# Patient Record
Sex: Male | Born: 1945 | Race: White | Hispanic: No | Marital: Single | Smoking: Former smoker
Health system: Southern US, Community
[De-identification: ages and names within clinical notes are randomized; demographics above are authoritative.]

## PROBLEM LIST (undated history)

## (undated) DIAGNOSIS — I10 Essential (primary) hypertension: Secondary | ICD-10-CM

## (undated) DIAGNOSIS — H269 Unspecified cataract: Secondary | ICD-10-CM

## (undated) DIAGNOSIS — E785 Hyperlipidemia, unspecified: Secondary | ICD-10-CM

## (undated) DIAGNOSIS — E119 Type 2 diabetes mellitus without complications: Secondary | ICD-10-CM

## (undated) DIAGNOSIS — Z86010 Personal history of colonic polyps: Secondary | ICD-10-CM

## (undated) HISTORY — DX: Essential (primary) hypertension: I10

## (undated) HISTORY — DX: Type 2 diabetes mellitus without complications: E11.9

## (undated) HISTORY — PX: CATARACT EXTRACTION: SUR2

## (undated) HISTORY — DX: Unspecified cataract: H26.9

## (undated) HISTORY — PX: OTHER SURGICAL HISTORY: SHX169

## (undated) HISTORY — DX: Personal history of colonic polyps: Z86.010

## (undated) HISTORY — DX: Hyperlipidemia, unspecified: E78.5

---

## 2008-11-19 ENCOUNTER — Ambulatory Visit: Payer: Self-pay | Admitting: Family Medicine

## 2010-07-29 ENCOUNTER — Ambulatory Visit
Admission: RE | Admit: 2010-07-29 | Discharge: 2010-07-29 | Payer: Self-pay | Source: Home / Self Care | Attending: Family Medicine | Admitting: Family Medicine

## 2010-12-25 ENCOUNTER — Telehealth: Payer: Self-pay | Admitting: Medical

## 2010-12-25 NOTE — Telephone Encounter (Signed)
Pt actually called for someone else

## 2011-06-25 ENCOUNTER — Encounter: Payer: Self-pay | Admitting: Family Medicine

## 2011-06-25 ENCOUNTER — Ambulatory Visit (INDEPENDENT_AMBULATORY_CARE_PROVIDER_SITE_OTHER): Payer: Self-pay | Admitting: Family Medicine

## 2011-06-25 VITALS — BP 142/90 | HR 82 | Wt 241.0 lb

## 2011-06-25 DIAGNOSIS — R040 Epistaxis: Secondary | ICD-10-CM

## 2011-06-25 NOTE — Progress Notes (Signed)
  Subjective:    Patient ID: Peter Daniels, male    DOB: 04/20/1946, 65 y.o.   MRN: [id number]  HPI He is here for evaluation of a history of over the last several weeks having 3 episodes of epistaxis mainly from the left. Last one lasted several hours. He is concerned over the need for cauterization.   Review of Systems     Objective:   Physical Exam Alert and in no distress. Exam of his nares does show redness and irritation to the medial aspect of the nares but no true vessels. Right nares is normal.      Assessment & Plan:  Recent epistaxis. Explained since there was no actual vessels, cauterizing would not be appropriate. Discussed proper care of nosebleeds with compression and the potential use of Afrin to help constrict vessels.

## 2011-06-25 NOTE — Patient Instructions (Signed)
With your next nose bleed when you had slightly forward and compress the soft part of your nose. He may also use Afrin on tissue or cotton swab to help with constriction.

## 2011-07-09 ENCOUNTER — Other Ambulatory Visit (INDEPENDENT_AMBULATORY_CARE_PROVIDER_SITE_OTHER): Payer: Self-pay

## 2011-07-09 DIAGNOSIS — Z23 Encounter for immunization: Secondary | ICD-10-CM

## 2013-03-09 ENCOUNTER — Encounter: Payer: Self-pay | Admitting: Internal Medicine

## 2013-03-10 ENCOUNTER — Ambulatory Visit (INDEPENDENT_AMBULATORY_CARE_PROVIDER_SITE_OTHER): Payer: Medicare Other | Admitting: Family Medicine

## 2013-03-10 ENCOUNTER — Encounter: Payer: Self-pay | Admitting: Family Medicine

## 2013-03-10 VITALS — BP 130/88 | HR 95 | Ht 70.0 in | Wt 236.0 lb

## 2013-03-10 DIAGNOSIS — L719 Rosacea, unspecified: Secondary | ICD-10-CM

## 2013-03-10 DIAGNOSIS — Z0000 Encounter for general adult medical examination without abnormal findings: Secondary | ICD-10-CM | POA: Diagnosis not present

## 2013-03-10 DIAGNOSIS — E669 Obesity, unspecified: Secondary | ICD-10-CM | POA: Diagnosis not present

## 2013-03-10 DIAGNOSIS — Z23 Encounter for immunization: Secondary | ICD-10-CM | POA: Diagnosis not present

## 2013-03-10 LAB — POCT URINALYSIS DIPSTICK
Bilirubin, UA: NEGATIVE
Blood, UA: NEGATIVE
Ketones, UA: NEGATIVE
pH, UA: 7

## 2013-03-10 LAB — CBC WITH DIFFERENTIAL/PLATELET
Basophils Relative: 1 % (ref 0–1)
Eosinophils Absolute: 0.2 10*3/uL (ref 0.0–0.7)
HCT: 46.3 % (ref 39.0–52.0)
Hemoglobin: 16 g/dL (ref 13.0–17.0)
MCH: 30.2 pg (ref 26.0–34.0)
MCHC: 34.6 g/dL (ref 30.0–36.0)
MCV: 87.5 fL (ref 78.0–100.0)
Monocytes Absolute: 0.4 10*3/uL (ref 0.1–1.0)
Monocytes Relative: 5 % (ref 3–12)

## 2013-03-10 NOTE — Progress Notes (Signed)
  Subjective:    Patient ID: Peter Daniels, male    DOB: 01/26/1946, 67 y.o.   MRN: [id number]  HPI He is here for complete examination. He has some difficulty with rosacea and would like to be referred to dermatology. He is taking aspirin. He is on no other medications. His exercise pattern is quite minimal. His immunizations were reviewed. Family and social history were also reviewed. He does admit to drinking 3 beverages per night.   Review of Systems  Constitutional: Negative.   HENT: Negative.   Eyes: Negative.   Respiratory: Negative.   Cardiovascular: Negative.   Gastrointestinal: Negative.   Endocrine: Negative.   Genitourinary: Negative.   Musculoskeletal: Negative.   Allergic/Immunologic: Negative.   Neurological: Negative.   Hematological: Negative.   Psychiatric/Behavioral: Negative.        Objective:   Physical Exam BP 130/88  Pulse 95  Ht 5\' 10"  (1.778 m)  Wt 236 lb (107.049 kg)  BMI 33.86 kg/m2  General Appearance:    Alert, cooperative, no distress, appears stated age  Head:    Normocephalic, without obvious abnormality, atraumatic  Eyes:    PERRL, conjunctiva/corneas clear, EOM's intact, fundi    benign  Ears:    Normal TM's and external ear canals  Nose:   Nares normal, mucosa normal, no drainage or sinus   tenderness  Throat:   Lips, mucosa, and tongue normal; teeth and gums normal  Neck:   Supple, no lymphadenopathy;  thyroid:  no   enlargement/tenderness/nodules; no carotid   bruit or JVD  Back:    Spine nontender, no curvature, ROM normal, no CVA     tenderness  Lungs:     Clear to auscultation bilaterally without wheezes, rales or     ronchi; respirations unlabored  Chest Wall:    No tenderness or deformity   Heart:    Regular rate and rhythm, S1 and S2 normal, no murmur, rub   or gallop  Breast Exam:    No chest wall tenderness, masses or gynecomastia  Abdomen:     Soft, non-tender, nondistended, normoactive bowel sounds,    no masses, no  hepatosplenomegaly  Genitalia:    Normal male external genitalia without lesions.  Testicles without masses.  No inguinal hernias.  Rectal:   deferred   Extremities:   No clubbing, cyanosis or edema  Pulses:   2+ and symmetric all extremities  Skin:   Skin color, texture, turgor normal, slight erythema is noted over the alar area as well as nose.   Lymph nodes:   Cervical, supraclavicular, and axillary nodes normal  Neurologic:   CNII-XII intact, normal strength, sensation and gait; reflexes 2+ and symmetric throughout          Psych:   Normal mood, affect, hygiene and grooming.          Assessment & Plan:  Routine general medical examination at a health care facility - Plan: POCT Urinalysis Dipstick, Pneumococcal polysaccharide vaccine 23-valent greater than or equal to 2yo subcutaneous/IM, HM COLONOSCOPY, CBC with Differential, Comprehensive metabolic panel  Rosacea - Plan: Ambulatory referral to Dermatology  Obesity (BMI 30-39.9) - Plan: CBC with Differential, Comprehensive metabolic panel  pancreas him to increase his physical activity to Lisa 150 minutes per week of anything he would like. Also recommended that he cut back on his alcohol consumption to one or 2 beverages per night.

## 2013-03-11 LAB — COMPREHENSIVE METABOLIC PANEL
Albumin: 4.5 g/dL (ref 3.5–5.2)
Alkaline Phosphatase: 48 U/L (ref 39–117)
BUN: 10 mg/dL (ref 6–23)
Glucose, Bld: 226 mg/dL — ABNORMAL HIGH (ref 70–99)
Total Bilirubin: 2.1 mg/dL — ABNORMAL HIGH (ref 0.3–1.2)

## 2013-03-13 NOTE — Progress Notes (Signed)
Quick Note:  PT INFORMED AND VERBALIZED UNDERSTANDING  ______ 

## 2013-04-04 ENCOUNTER — Telehealth: Payer: Self-pay | Admitting: Internal Medicine

## 2013-04-04 NOTE — Telephone Encounter (Signed)
Pt states that he was suppose to have a GI referral for colonoscopy and a referral to the dermatology. Pt wanted to know if this has been done yet

## 2013-04-06 ENCOUNTER — Telehealth: Payer: Self-pay | Admitting: Family Medicine

## 2013-04-06 ENCOUNTER — Other Ambulatory Visit: Payer: Self-pay

## 2013-04-06 DIAGNOSIS — Z1389 Encounter for screening for other disorder: Secondary | ICD-10-CM

## 2013-04-06 DIAGNOSIS — Z1211 Encounter for screening for malignant neoplasm of colon: Secondary | ICD-10-CM

## 2013-04-06 NOTE — Telephone Encounter (Signed)
DR.LUPTON OCT 6 3:30 LUPTON

## 2013-04-06 NOTE — Telephone Encounter (Signed)
Please call patient concerning dermatology referral

## 2013-04-06 NOTE — Telephone Encounter (Signed)
Colonoscopy sent to [facility] and waiting on der. Dr Terri Piedra to open back up at 2

## 2013-04-11 ENCOUNTER — Encounter: Payer: Self-pay | Admitting: Gastroenterology

## 2013-05-16 DIAGNOSIS — L819 Disorder of pigmentation, unspecified: Secondary | ICD-10-CM | POA: Diagnosis not present

## 2013-05-16 DIAGNOSIS — L219 Seborrheic dermatitis, unspecified: Secondary | ICD-10-CM | POA: Diagnosis not present

## 2013-05-16 DIAGNOSIS — B356 Tinea cruris: Secondary | ICD-10-CM | POA: Diagnosis not present

## 2013-05-16 DIAGNOSIS — L570 Actinic keratosis: Secondary | ICD-10-CM | POA: Diagnosis not present

## 2013-05-16 DIAGNOSIS — D235 Other benign neoplasm of skin of trunk: Secondary | ICD-10-CM | POA: Diagnosis not present

## 2013-06-19 ENCOUNTER — Other Ambulatory Visit: Payer: Self-pay | Admitting: Gastroenterology

## 2014-01-02 ENCOUNTER — Ambulatory Visit (INDEPENDENT_AMBULATORY_CARE_PROVIDER_SITE_OTHER): Payer: Medicare Other | Admitting: Family Medicine

## 2014-01-02 ENCOUNTER — Encounter: Payer: Self-pay | Admitting: Family Medicine

## 2014-01-02 DIAGNOSIS — L089 Local infection of the skin and subcutaneous tissue, unspecified: Secondary | ICD-10-CM

## 2014-01-02 DIAGNOSIS — L723 Sebaceous cyst: Secondary | ICD-10-CM

## 2014-01-02 NOTE — Progress Notes (Signed)
   Subjective:    Patient ID: Peter Daniels, male    DOB: [birth year], 68 y.o.   MRN: [id number]  HPI He is here for evaluation and treatment of a painful lesion to the left cheek that has been increasing in size over the last week.   Review of Systems     Objective:   Physical Exam Exam of the left cheek shows a 3 cm red warm and tender lesion with central dimple.       Assessment & Plan:  Infected sebaceous cyst  the lesion was injected with Xylocaine and epinephrine. A 2 cm incision was main in the lines of facial expression. Purulent material with cyst sac was removed. The wound was cleaned thoroughly and packed with iodoform. He will return here in 2 days for recheck.

## 2014-01-04 ENCOUNTER — Ambulatory Visit (INDEPENDENT_AMBULATORY_CARE_PROVIDER_SITE_OTHER): Payer: Medicare Other | Admitting: Medical

## 2014-01-04 ENCOUNTER — Encounter: Payer: Self-pay | Admitting: Medical

## 2014-01-04 VITALS — BP 140/80 | HR 82 | Temp 98.0°F | Resp 17 | Wt 233.0 lb

## 2014-01-04 DIAGNOSIS — L0201 Cutaneous abscess of face: Secondary | ICD-10-CM

## 2014-01-04 DIAGNOSIS — L03211 Cellulitis of face: Secondary | ICD-10-CM

## 2014-01-04 MED ORDER — SULFAMETHOXAZOLE-TMP DS 800-160 MG PO TABS: 1.0000 | ORAL_TABLET | Freq: Two times a day (BID) | ORAL | Status: DC

## 2014-01-04 NOTE — Progress Notes (Signed)
Subjective: Here for followup on abscess of face. Was seen by Dr. Redmond School a few days ago had an incision and drainage of left cheek abscess.  He feels like the area is more painful now more discomfort, there is some drainage but no fever, nausea or vomiting  Objective: Left cheek with at least 3 cm area of induration, there is an incision with packing in place  Assessment: Encounter Diagnosis  Name Primary?  . Facial abscess Yes   Plan: Clean left cheek in normal sterile fashion, removed old packing,irrigated with high pressure saline, used about 3 cm of one quarter inch iodoform packing. Covered with sterile bandage  Begin Bactrim antibiotic, he'll remove the packing on Sunday.  Continue to keep the area clean with soap and water, followup here on Monday

## 2014-01-08 ENCOUNTER — Ambulatory Visit (INDEPENDENT_AMBULATORY_CARE_PROVIDER_SITE_OTHER): Payer: Medicare Other | Admitting: Family Medicine

## 2014-01-08 DIAGNOSIS — L03211 Cellulitis of face: Secondary | ICD-10-CM

## 2014-01-08 DIAGNOSIS — L0201 Cutaneous abscess of face: Secondary | ICD-10-CM

## 2014-01-08 NOTE — Progress Notes (Signed)
   Subjective:    Patient ID: Peter Daniels, male    DOB: 1946-05-21, 68 y.o.   MRN: [id number]  HPI He is here for recheck. He did remove the packing yesterday. The wound is still draining.   Review of Systems     Objective:   Physical Exam He has a 3-4 cm area of induration. Purulent material was expressed from the wound.       Assessment & Plan:  Facial abscess  the wound was probed and repacked with a small amount of iodoform. He is to return here on Thursday for a recheck. Continue the antibiotic.

## 2014-01-11 ENCOUNTER — Ambulatory Visit (INDEPENDENT_AMBULATORY_CARE_PROVIDER_SITE_OTHER): Payer: Medicare Other | Admitting: Family Medicine

## 2014-01-11 ENCOUNTER — Other Ambulatory Visit: Payer: Self-pay

## 2014-01-11 DIAGNOSIS — R7309 Other abnormal glucose: Secondary | ICD-10-CM | POA: Diagnosis not present

## 2014-01-11 DIAGNOSIS — E119 Type 2 diabetes mellitus without complications: Secondary | ICD-10-CM

## 2014-01-11 DIAGNOSIS — L03211 Cellulitis of face: Secondary | ICD-10-CM | POA: Diagnosis not present

## 2014-01-11 DIAGNOSIS — L0201 Cutaneous abscess of face: Secondary | ICD-10-CM

## 2014-01-11 LAB — CBC WITH DIFFERENTIAL/PLATELET
Basophils Absolute: 0.1 10*3/uL (ref 0.0–0.1)
Basophils Relative: 1 % (ref 0–1)
EOS PCT: 3 % (ref 0–5)
Eosinophils Absolute: 0.2 10*3/uL (ref 0.0–0.7)
HEMATOCRIT: 43.1 % (ref 39.0–52.0)
Hemoglobin: 15 g/dL (ref 13.0–17.0)
LYMPHS ABS: 1.6 10*3/uL (ref 0.7–4.0)
LYMPHS PCT: 24 % (ref 12–46)
MCH: 30.4 pg (ref 26.0–34.0)
MCHC: 34.8 g/dL (ref 30.0–36.0)
MCV: 87.2 fL (ref 78.0–100.0)
MONO ABS: 0.5 10*3/uL (ref 0.1–1.0)
MONOS PCT: 7 % (ref 3–12)
Neutro Abs: 4.2 10*3/uL (ref 1.7–7.7)
Neutrophils Relative %: 65 % (ref 43–77)
PLATELETS: 205 10*3/uL (ref 150–400)
RBC: 4.94 MIL/uL (ref 4.22–5.81)
RDW: 13.3 % (ref 11.5–15.5)
WBC: 6.5 10*3/uL (ref 4.0–10.5)

## 2014-01-11 LAB — LIPID PANEL
Cholesterol: 242 mg/dL — ABNORMAL HIGH (ref 0–200)
HDL: 53 mg/dL (ref >39)
LDL Cholesterol: 146 mg/dL — ABNORMAL HIGH (ref 0–99)
TRIGLYCERIDES: 213 mg/dL — AB (ref <150)
Total CHOL/HDL Ratio: 4.6 Ratio
VLDL: 43 mg/dL — ABNORMAL HIGH (ref 0–40)

## 2014-01-11 LAB — COMPREHENSIVE METABOLIC PANEL
ALT: 19 U/L (ref 0–53)
AST: 18 U/L (ref 0–37)
Albumin: 4.3 g/dL (ref 3.5–5.2)
Alkaline Phosphatase: 52 U/L (ref 39–117)
BUN: 12 mg/dL (ref 6–23)
CHLORIDE: 95 meq/L — AB (ref 96–112)
CO2: 23 mEq/L (ref 19–32)
CREATININE: 0.9 mg/dL (ref 0.50–1.35)
Calcium: 9.4 mg/dL (ref 8.4–10.5)
GLUCOSE: 349 mg/dL — AB (ref 70–99)
Potassium: 4.4 mEq/L (ref 3.5–5.3)
Sodium: 128 mEq/L — ABNORMAL LOW (ref 135–145)
Total Bilirubin: 1.3 mg/dL — ABNORMAL HIGH (ref 0.2–1.2)
Total Protein: 7.2 g/dL (ref 6.0–8.3)

## 2014-01-11 LAB — POCT GLYCOSYLATED HEMOGLOBIN (HGB A1C): Hemoglobin A1C: 9.5

## 2014-01-11 MED ORDER — ONETOUCH DELICA LANCETS FINE MISC: 1.0000 | Freq: Two times a day (BID) | Status: DC

## 2014-01-11 MED ORDER — ONETOUCH VERIO IQ SYSTEM W/DEVICE KIT: PACK | Status: DC

## 2014-01-11 MED ORDER — GLUCOSE BLOOD VI STRP: 1.0000 | ORAL_STRIP | Freq: Two times a day (BID) | Status: DC

## 2014-01-11 MED ORDER — SULFAMETHOXAZOLE-TMP DS 800-160 MG PO TABS: 1.0000 | ORAL_TABLET | Freq: Two times a day (BID) | ORAL | Status: DC

## 2014-01-11 MED ORDER — METFORMIN HCL 500 MG PO TABS: 500.0000 mg | ORAL_TABLET | Freq: Two times a day (BID) | ORAL | Status: DC

## 2014-01-11 NOTE — Patient Instructions (Signed)
Check the American diabetes association website or [facility]org

## 2014-01-11 NOTE — Progress Notes (Signed)
   Subjective:    Patient ID: Peter Daniels, male    DOB: 06-17-1946, 68 y.o.   MRN: [id number]  HPI He is here for a recheck on facial abscess. He states it is not hurting as much and there is no drainage. I also reviewed his record and noted a elevated blood sugars that was misread as normal. He has had no difficulty with polyuria, polydipsia, weight loss.   Review of Systems     Objective:   Physical Exam Alert and in no distress. The induration is reduced by at least 1 cm. No drainage is noted. The packing was removed. Hemoglobin A1c is 9.5        Assessment & Plan:  Elevated glucose - Plan: POCT glycosylated hemoglobin (Hb A1C)  Facial abscess - Plan: sulfamethoxazole-trimethoprim (BACTRIM DS) 800-160 MG per tablet  New onset type 2 diabetes mellitus - Plan: Amb Referral to [facility], metFORMIN (GLUCOPHAGE) 500 MG tablet, CBC with Differential, Comprehensive metabolic panel, Lipid panel  he will use warm soaks and irrigate the lesion on his face. I will give him another 10 days worth of Septra. I discussed diabetes with him. Discussed diet, exercise, medications for blood pressure cholesterol and blood sugar. We also discussed risk factors of stroke, blindness, heart disease, kidney failure and are up and neurologic damage as well as other vascular issues. He was given instructions on use of the glucometer and when to test that specifically before meals or 2 hours after that. Return here in 2 weeks for recheck. Also discussed starting metformin and possible side effects. He will call if he has any problems.

## 2014-01-11 NOTE — Telephone Encounter (Signed)
Sent in all diabetes supply

## 2014-01-15 ENCOUNTER — Encounter: Payer: Self-pay | Admitting: Family Medicine

## 2014-01-15 ENCOUNTER — Other Ambulatory Visit: Payer: Self-pay

## 2014-01-15 ENCOUNTER — Ambulatory Visit (INDEPENDENT_AMBULATORY_CARE_PROVIDER_SITE_OTHER): Payer: Medicare Other | Admitting: Family Medicine

## 2014-01-15 ENCOUNTER — Telehealth: Payer: Self-pay

## 2014-01-15 VITALS — BP 140/80 | HR 96 | Wt 226.0 lb

## 2014-01-15 DIAGNOSIS — E119 Type 2 diabetes mellitus without complications: Secondary | ICD-10-CM | POA: Diagnosis not present

## 2014-01-15 MED ORDER — GLYBURIDE 5 MG PO TABS: 5.0000 mg | ORAL_TABLET | Freq: Every day | ORAL | Status: DC

## 2014-01-15 NOTE — Patient Instructions (Addendum)
Hold taking the metformin. We'll put you on a different medicine short-term for the diabetes. Call me when the diarrhea stops and then we'll talk about switching you back to the metformin

## 2014-01-15 NOTE — Progress Notes (Signed)
Patient states that his blood sugar avg. Since Saturday has been 220 I took his sugar reading in office today it was 233 also he states that the metformin he thinks is making him sick.

## 2014-01-15 NOTE — Telephone Encounter (Signed)
DR.LALONDE Peter Daniels IS TAKING REG METFORMIN THE PHARMACIST SAID THAT GEN. FOR XR IS METFORMIN XR 500 24 HR

## 2014-01-15 NOTE — Progress Notes (Signed)
   Subjective:    Patient ID: Peter Daniels, male    DOB: 07/22/1945, 68 y.o.   MRN: [id number]  HPI He is here for recheck. His blood sugars have been running in the low 200s. He has had some difficulty with diarrhea and has vomited on 2 separate occasions. He does feel somewhat weak.   Review of Systems     Objective:   Physical Exam Alert and in no distress otherwise not examined       Assessment & Plan:  New onset type 2 diabetes mellitus - Plan: glyBURIDE (DIABETA) 5 MG tablet  he is to hold his metformin to minimize its effect on the diarrhea. We will check on the type of metformin he is on and possibly changing to long-acting. He is to call me when his diarrhea stops.

## 2014-01-17 ENCOUNTER — Telehealth: Payer: Self-pay | Admitting: Family Medicine

## 2014-01-17 NOTE — Telephone Encounter (Signed)
Left word for word message on pt cell # 

## 2014-01-17 NOTE — Telephone Encounter (Signed)
Pt called and stated he was to inform you when the diarrhea had stopped. Pt states it stopped yesterday.

## 2014-01-17 NOTE — Telephone Encounter (Signed)
Having start back on the metformin and let us know if the diarrhea recurs. If it does then switch him to the long-acting variety

## 2014-01-30 ENCOUNTER — Ambulatory Visit (INDEPENDENT_AMBULATORY_CARE_PROVIDER_SITE_OTHER): Payer: Medicare Other | Admitting: Family Medicine

## 2014-01-30 VITALS — BP 120/80 | HR 80 | Temp 97.9°F | Wt 221.0 lb

## 2014-01-30 DIAGNOSIS — E119 Type 2 diabetes mellitus without complications: Secondary | ICD-10-CM

## 2014-01-30 NOTE — Progress Notes (Signed)
   Subjective:    Patient ID: Peter Daniels, male    DOB: [birth year], 68 y.o.   MRN: [id number]  HPI He is here for recheck. He still does feel weak from recent bout of viral gastroenteritis but states that he is at least 50% better as compared earlier when he was much worse. His blood sugars now run under 250. He did not start on the sulfonylurea. He does continue on his Glucophage. He has had no more difficulty with diarrhea.   Review of Systems     Objective:   Physical Exam Alert and in no distress otherwise not examined       Assessment & Plan:  New onset type 2 diabetes mellitus  I explained that since his blood sugars are running below 250, we will continue the metformin. He does realize with weight loss and dietary changes as well as exercise he might possibly be a low back off on some of his medications and that is his goal.

## 2014-02-12 ENCOUNTER — Other Ambulatory Visit: Payer: Self-pay | Admitting: Family Medicine

## 2014-02-12 ENCOUNTER — Telehealth: Payer: Self-pay | Admitting: Family Medicine

## 2014-02-12 NOTE — Telephone Encounter (Signed)
Called and left word for word message pt only needs to test 1 to 2 times a day and to stager it each day before a meal or 2 hrs after a meal

## 2014-02-12 NOTE — Telephone Encounter (Signed)
IS THIS OKAY 

## 2014-02-12 NOTE — Telephone Encounter (Signed)
Let him know that he does not need to check it 4 times per day. Since he is new once or twice a day and still good explain the before and 2 hours after her regimen.

## 2014-02-12 NOTE — Telephone Encounter (Signed)
Pt called and wants to start testing his blood sugars 4 times a day and request refill on his strips and lancets to [facility] on [address] in [place].  Pt ph [phone]

## 2014-02-15 NOTE — Telephone Encounter (Signed)
PT INFORMED WE DO NOT HAVE ANY SAMPLES SO I ADVISED HIM TO TEST ONE TIME A DAY TILL HE CAN GET A REFILL PT VERBALIZED UNDERSTANDING

## 2014-02-15 NOTE — Telephone Encounter (Signed)
Peter Daniels called back and stated he hadnt heard anything concerning his message. I told him a message was left for him but he stated he didn't get it. Message was relay per [employer] instructions. Pt verbalized understanding but states because he has been testing so much he is out of test strips and his insurance wont pay for it. Do we have any samples of these in the back to get him thru until he can get a refill? If not what should pt do? He only has 20 strips left. Please advise pt.

## 2014-02-27 ENCOUNTER — Encounter: Payer: Medicare Other | Attending: Family Medicine

## 2014-02-27 VITALS — Ht 71.0 in | Wt 215.5 lb

## 2014-02-27 DIAGNOSIS — E119 Type 2 diabetes mellitus without complications: Secondary | ICD-10-CM

## 2014-02-27 DIAGNOSIS — Z713 Dietary counseling and surveillance: Secondary | ICD-10-CM | POA: Insufficient documentation

## 2014-02-27 NOTE — Progress Notes (Signed)
Patient was seen on 02/27/14 for the first of a series of three diabetes self-management courses at [facility].  Patient Education Plan per assessed needs and concerns is to attend four course education program for Diabetes Self Management Education.  Current HbA1c: 9.5%  The following learning objectives were met by the patient during this class:  Describe diabetes  State some common risk factors for diabetes  Defines the role of glucose and insulin  Identifies type of diabetes and pathophysiology  Describe the relationship between diabetes and cardiovascular risk  State the members of the Healthcare Team  States the rationale for glucose monitoring  State when to test glucose  State their individual Target Range  State the importance of logging glucose readings  Describe how to interpret glucose readings  Identifies A1C target  Explain the correlation between A1c and eAG values  State symptoms and treatment of high blood glucose  State symptoms and treatment of low blood glucose  Explain proper technique for glucose testing  Identifies proper sharps disposal  Handouts given during class include:  Living Well with Diabetes book  Carb Counting and Meal Planning book  Meal Plan Card  Carbohydrate guide  Meal planning worksheet  Low Sodium Flavoring Tips  The diabetes portion plate  M0L to eAG Conversion Chart  Diabetes Medications  Diabetes Recommended Care Schedule  Support Group  Diabetes Success Plan  Core Class Satisfaction Survey  Follow-Up Plan:  Attend core 2

## 2014-03-01 ENCOUNTER — Ambulatory Visit (INDEPENDENT_AMBULATORY_CARE_PROVIDER_SITE_OTHER): Payer: Medicare Other | Admitting: Family Medicine

## 2014-03-01 ENCOUNTER — Encounter: Payer: Self-pay | Admitting: Family Medicine

## 2014-03-01 VITALS — BP 126/80 | HR 70 | Wt 215.0 lb

## 2014-03-01 DIAGNOSIS — E785 Hyperlipidemia, unspecified: Secondary | ICD-10-CM | POA: Diagnosis not present

## 2014-03-01 DIAGNOSIS — E119 Type 2 diabetes mellitus without complications: Secondary | ICD-10-CM | POA: Diagnosis not present

## 2014-03-01 DIAGNOSIS — E1169 Type 2 diabetes mellitus with other specified complication: Secondary | ICD-10-CM

## 2014-03-01 DIAGNOSIS — E118 Type 2 diabetes mellitus with unspecified complications: Secondary | ICD-10-CM | POA: Insufficient documentation

## 2014-03-01 MED ORDER — ATORVASTATIN CALCIUM 20 MG PO TABS: 20.0000 mg | ORAL_TABLET | Freq: Every day | ORAL | Status: DC

## 2014-03-01 NOTE — Patient Instructions (Signed)
Your blood sugars before you eat should be in the low 100s and 2 hours after you he should be below 180

## 2014-03-01 NOTE — Progress Notes (Signed)
   Subjective:    Patient ID: Peter Daniels, male    DOB: 1946/06/10, 68 y.o.   MRN: [id number]  HPI He is here for recheck on his diabetes. Since his last visit he has been to the nutritionist and has learned a fair amount. He is now exercising more and checking his blood sugars twice per day. He has noted his blood sugar readings coming down.   Review of Systems     Objective:   Physical Exam Alert and in no distress. His medications were reviewed. Blood studies were reviewed. He does have elevated lipids.       Assessment & Plan:  New onset type 2 diabetes mellitus  Hyperlipidemia associated with type 2 diabetes mellitus - Plan: atorvastatin (LIPITOR) 20 MG tablet  I will start him on Lipitor. Discussed possible side effects of this medication and if he has difficulty he will let me know. I again discussed diet and exercise. I explained the mechanism of diabetes in regard to insulin production. Discussed the fact that with time he will more likely than not require more more medication which is a natural history of this disease. Did recommend he continue to check his blood sugars before and 2 hours after meals. He seems to be making good progress. Recheck here in 2 months.

## 2014-03-02 ENCOUNTER — Encounter: Payer: Self-pay | Admitting: Internal Medicine

## 2014-03-02 DIAGNOSIS — H259 Unspecified age-related cataract: Secondary | ICD-10-CM | POA: Diagnosis not present

## 2014-03-02 DIAGNOSIS — H43819 Vitreous degeneration, unspecified eye: Secondary | ICD-10-CM | POA: Diagnosis not present

## 2014-03-02 DIAGNOSIS — E119 Type 2 diabetes mellitus without complications: Secondary | ICD-10-CM | POA: Diagnosis not present

## 2014-03-02 DIAGNOSIS — H5210 Myopia, unspecified eye: Secondary | ICD-10-CM | POA: Diagnosis not present

## 2014-03-02 LAB — HM DIABETES EYE EXAM

## 2014-03-06 ENCOUNTER — Encounter: Payer: Medicare Other | Attending: Family Medicine

## 2014-03-06 DIAGNOSIS — E119 Type 2 diabetes mellitus without complications: Secondary | ICD-10-CM | POA: Diagnosis not present

## 2014-03-06 DIAGNOSIS — Z713 Dietary counseling and surveillance: Secondary | ICD-10-CM | POA: Diagnosis not present

## 2014-03-06 NOTE — Progress Notes (Signed)

## 2014-03-13 DIAGNOSIS — E1059 Type 1 diabetes mellitus with other circulatory complications: Secondary | ICD-10-CM

## 2014-03-13 DIAGNOSIS — E119 Type 2 diabetes mellitus without complications: Secondary | ICD-10-CM | POA: Diagnosis not present

## 2014-03-13 NOTE — Progress Notes (Signed)
Patient was seen on 03/13/14 for the third of a series of three diabetes self-management courses at the [facility]. The following learning objectives were met by the patient during this class:    State the amount of activity recommended for healthy living   Describe activities suitable for individual needs   Identify ways to regularly incorporate activity into daily life   Identify barriers to activity and ways to over come these barriers  Identify diabetes medications being personally used and their primary action for lowering glucose and possible side effects   Describe role of stress on blood glucose and develop strategies to address psychosocial issues   Identify diabetes complications and ways to prevent them  Explain how to manage diabetes during illness   Evaluate success in meeting personal goal   Establish 2-3 goals that they will plan to diligently work on until they return for the  21-monthfollow-up visit  Goals:  Follow Diabetes Meal Plan as instructed  Aim for 15-30 mins of physical activity daily as tolerated  Bring food record and glucose log to your follow up visit  Your patient has established the following 4 month goals in their individualized success plan: I will count my carb choices at most meals and snacks and monitor portion control I will be active 30 minutes or more 5 times a week I will take my diabetes medications as scheduled I will eat less unhealthy fats  I will test my glucose at least 2 times a day, 7 days a week I will look at patterns in my record book at least 3 days a month To help manage stress I will  Do fun things at least 4 times a week  Your patient has identified these potential barriers to change:  motivation  Your patient has identified their diabetes self-care support plan as  [facility]  Books  Magazine subscriptions  On-Line resources  Plan:  Attend Core 4 in 4 months

## 2014-05-01 ENCOUNTER — Encounter: Payer: Self-pay | Admitting: Family Medicine

## 2014-05-01 ENCOUNTER — Ambulatory Visit (INDEPENDENT_AMBULATORY_CARE_PROVIDER_SITE_OTHER): Payer: Medicare Other | Admitting: Family Medicine

## 2014-05-01 VITALS — BP 120/82 | HR 81 | Wt 198.0 lb

## 2014-05-01 DIAGNOSIS — E119 Type 2 diabetes mellitus without complications: Secondary | ICD-10-CM

## 2014-05-01 DIAGNOSIS — Z23 Encounter for immunization: Secondary | ICD-10-CM | POA: Diagnosis not present

## 2014-05-01 LAB — POCT GLYCOSYLATED HEMOGLOBIN (HGB A1C): HEMOGLOBIN A1C: 6.1

## 2014-05-01 NOTE — Progress Notes (Signed)
  Subjective:    Peter Daniels is a 68 y.o. male who presents for follow-up of Type 2 diabetes mellitus.    Home blood sugar records: Patient test B/S bid some times more they have been running around 130  Current symptoms/problems none Daily foot checks: Any foot concerns: none Last eye exam: 03/02/14    Medication compliance: Excellent Current diet: eating less avoiding sugar  Current exercise: walking more Known diabetic complications: none Cardiovascular risk factors: advanced age (older than 55 for men, 38 for women), diabetes mellitus, dyslipidemia, male gender, obesity (BMI >= 30 kg/m2) and sedentary lifestyle   The following portions of the patient's history were reviewed and updated as appropriate: allergies, current medications, past family history, past medical history and problem list.  ROS as in subjective above    Objective:   General appearence: alert, no distress, WD/WN  Lab Review Lab Results  Component Value Date   HGBA1C 9.5 01/11/2014   Lab Results  Component Value Date   CHOL 242* 01/11/2014   HDL 53 01/11/2014   LDLCALC 146* 01/11/2014   TRIG 213* 01/11/2014   CHOLHDL 4.6 01/11/2014   No results found for this basenameDerl Barrow     Chemistry      Component Value Date/Time   NA 128* 01/11/2014 1115   K 4.4 01/11/2014 1115   CL 95* 01/11/2014 1115   CO2 23 01/11/2014 1115   BUN 12 01/11/2014 1115   CREATININE 0.90 01/11/2014 1115      Component Value Date/Time   CALCIUM 9.4 01/11/2014 1115   ALKPHOS 52 01/11/2014 1115   AST 18 01/11/2014 1115   ALT 19 01/11/2014 1115   BILITOT 1.3* 01/11/2014 1115        Chemistry      Component Value Date/Time   NA 128* 01/11/2014 1115   K 4.4 01/11/2014 1115   CL 95* 01/11/2014 1115   CO2 23 01/11/2014 1115   BUN 12 01/11/2014 1115   CREATININE 0.90 01/11/2014 1115      Component Value Date/Time   CALCIUM 9.4 01/11/2014 1115   ALKPHOS 52 01/11/2014 1115   AST 18 01/11/2014 1115   ALT 19 01/11/2014 1115   BILITOT 1.3*  01/11/2014 1115      Hemoglobin A1c is 6.1    Assessment:  Need for prophylactic vaccination against Streptococcus pneumoniae (pneumococcus) - Plan: Pneumococcal conjugate vaccine 13-valent  Need for prophylactic vaccination and inoculation against influenza - Plan: Flu vaccine HIGH DOSE PF (Fluzone Tri High dose)  Type 2 diabetes mellitus without complication - Plan: POCT glycosylated hemoglobin (Hb A1C)        Plan:    1.  Rx changes: none 2.  Education: Reviewed 'ABCs' of diabetes management (respective goals in parentheses):  A1C (<7), blood pressure (<130/80), and cholesterol (LDL <100). 3.  Compliance at present is estimated to be excellent. Efforts to improve compliance (if necessary) will be directed at increased exercise. 4. Follow up: 4 months  I congratulated him on the good work that he is doing. We will reevaluate this in several months. Discussed possibly even stopping the metformin but did caution that if such is the case, he would still need to be followed closely.

## 2014-06-14 ENCOUNTER — Telehealth: Payer: Self-pay | Admitting: Internal Medicine

## 2014-06-14 NOTE — Telephone Encounter (Signed)
Left a message for pt to call me back. We received a fax from [facility] for diabetic testing supplies. Trying to find out if he is needing this.

## 2014-06-18 NOTE — Telephone Encounter (Signed)
Pt already picked up his supplies

## 2014-07-02 ENCOUNTER — Other Ambulatory Visit: Payer: Self-pay | Admitting: Family Medicine

## 2014-07-09 ENCOUNTER — Encounter: Payer: Medicare Other | Attending: Family Medicine

## 2014-07-09 DIAGNOSIS — Z713 Dietary counseling and surveillance: Secondary | ICD-10-CM | POA: Insufficient documentation

## 2014-07-09 DIAGNOSIS — E119 Type 2 diabetes mellitus without complications: Secondary | ICD-10-CM | POA: Diagnosis not present

## 2014-07-10 NOTE — Progress Notes (Signed)
Appt start time: 0900 end time:  1000.  Patient was seen on 07/09/14 for a review of the series of three diabetes self-management courses at the [facility]. The following learning objectives were met by the patient during this class:  . Reviewed blood glucose monitoring and interpretation including the recommended target ranges and Hgb A1c.  . Reviewed on carb counting, importance of regularly scheduled meals/snacks, and meal planning.  . Reviewed the effects of physical activity on glucose levels and long-term glucose control.  Recommended goal of 150 minutes of physical activity/week. . Reviewed patient medications and discussed role of medication on blood glucose and possible side effects. . Discussed strategies to manage stress, psychosocial issues, and other obstacles to diabetes management. . Encouraged moderate weight reduction to improve glucose levels.   . Reviewed short-term complications: hyper- and hypo-glycemia.  Discussed causes, symptoms, and treatment options. . Reviewed prevention, detection, and treatment of long-term complications.  Discussed the role of prolonged elevated glucose levels on body systems.  Goals:  Follow Diabetes Meal Plan as instructed  Eat 3 meals and 2 snacks, every 3-5 hrs  Limit carbohydrate intake to 45 grams carbohydrate/meal Limit carbohydrate intake to 15 grams carbohydrate/snack Add lean protein foods to meals/snacks  Monitor glucose levels as instructed by your doctor  Aim for goal of 15-30 mins of physical activity daily as tolerated  Bring food record and glucose log to your next nutrition visit

## 2014-09-04 ENCOUNTER — Ambulatory Visit (INDEPENDENT_AMBULATORY_CARE_PROVIDER_SITE_OTHER): Payer: Medicare Other | Admitting: Family Medicine

## 2014-09-04 ENCOUNTER — Encounter: Payer: Self-pay | Admitting: Family Medicine

## 2014-09-04 VITALS — BP 128/88 | HR 70 | Wt 188.0 lb

## 2014-09-04 DIAGNOSIS — E1169 Type 2 diabetes mellitus with other specified complication: Secondary | ICD-10-CM

## 2014-09-04 DIAGNOSIS — E119 Type 2 diabetes mellitus without complications: Secondary | ICD-10-CM | POA: Diagnosis not present

## 2014-09-04 DIAGNOSIS — E785 Hyperlipidemia, unspecified: Secondary | ICD-10-CM | POA: Diagnosis not present

## 2014-09-04 DIAGNOSIS — Z136 Encounter for screening for cardiovascular disorders: Secondary | ICD-10-CM | POA: Diagnosis not present

## 2014-09-04 LAB — POCT GLYCOSYLATED HEMOGLOBIN (HGB A1C): Hemoglobin A1C: 5.6

## 2014-09-04 LAB — POCT UA - MICROALBUMIN
Albumin/Creatinine Ratio, Urine, POC: 7.5
Creatinine, POC: 94.9 mg/dL
Microalbumin Ur, POC: 7.1 mg/L

## 2014-09-04 NOTE — Progress Notes (Signed)
  Subjective:    Patient ID: Peter Daniels, male    DOB: September 28, 1945, 69 y.o.   MRN: [id number]  Peter Daniels is a 69 y.o. male who presents for follow-up of Type 2 diabetes mellitus.  Home blood sugar records: Patient tests regularly and notes in the mid 130s 2 hours postprandial Current symptoms/problems NONE Daily foot checks:   Any foot concerns:none Exercise walks further no real big change EYES: 08/02/13 The following portions of the patient's history were reviewed and updated as appropriate: allergies, current medications, past medical history, past social history and problem list.  ROS as in subjective above.     Objective:    Physical Exam Alert and in no distress otherwise not examined.   Lab Review Diabetic Labs Latest Ref Rng 05/01/2014 01/11/2014 03/10/2013  HbA1c - 6.1 9.5 -  Chol 0 - 200 mg/dL - 242(H) -  HDL >39 mg/dL - 53 -  Calc LDL 0 - 99 mg/dL - 146(H) -  Triglycerides <150 mg/dL - 213(H) -  Creatinine 0.50 - 1.35 mg/dL - 0.90 0.74   BP/Weight 05/01/2014 03/01/2014 02/27/2014 01/30/2014 8/33/8250  Systolic BP 539 767 - 341 937  Diastolic BP 82 80 - 80 80  Wt. (Lbs) 198 215 215.5 221 226  BMI 27.63 30 30.07 31.71 32.43   Foot/eye exam completion dates Latest Ref Rng 03/02/2014  Eye Exam No Retinopathy No Retinopathy  Foot Form Completion - -    [name]  reports that he has quit smoking. He does not have any smokeless tobacco history on file. He reports that he drinks about 12.0 oz of alcohol per week. He reports that he does not use illicit drugs. Hemoglobin A1c is 5.6    Assessment & Plan:      Encounter Diagnoses  Name Primary?  . Type 2 diabetes mellitus without complication Yes  . Hyperlipidemia associated with type 2 diabetes mellitus     changes: stop the metformin 1. Education: Reviewed 'ABCs' of diabetes management (respective goals in parentheses):  A1C (<7), blood pressure (<130/80), and cholesterol (LDL <100). 2. Compliance at  present is estimated to be excellent. Efforts to improve compliance (if necessary) will be directed at continue with diet and exercise. 3. Follow up: 4 months He is doing an excellent job. He is to start the Lipitor again and if he has difficulties with that, call for a different statin. I congratulated him on the good work that he is doing. Screening for AAA will be done. Patient not interested in colonoscopy.

## 2014-09-04 NOTE — Addendum Note (Signed)
Addended by: Minette Headland A on: 09/04/2014 09:43 AM   Modules accepted: Medications

## 2014-09-04 NOTE — Patient Instructions (Signed)
Check your blood sugars a couple times per week and if they start to go up and check them more often. Try the Lipitor and if there is any problem call me back

## 2014-09-24 ENCOUNTER — Ambulatory Visit (HOSPITAL_COMMUNITY)
Admission: RE | Admit: 2014-09-24 | Discharge: 2014-09-24 | Disposition: A | Discharge status: Home / Self Care | Payer: Medicare Other | Source: Ambulatory Visit | Attending: Family Medicine | Admitting: Family Medicine

## 2014-09-24 ENCOUNTER — Other Ambulatory Visit: Payer: Self-pay

## 2014-09-24 ENCOUNTER — Telehealth: Payer: Self-pay | Admitting: Internal Medicine

## 2014-09-24 DIAGNOSIS — Z136 Encounter for screening for cardiovascular disorders: Secondary | ICD-10-CM | POA: Insufficient documentation

## 2014-09-24 DIAGNOSIS — I700 Atherosclerosis of aorta: Secondary | ICD-10-CM | POA: Diagnosis not present

## 2014-09-24 DIAGNOSIS — Z87891 Personal history of nicotine dependence: Secondary | ICD-10-CM | POA: Diagnosis not present

## 2014-09-24 MED ORDER — GLUCOSE BLOOD VI STRP: 1.0000 | ORAL_STRIP | Freq: Two times a day (BID) | Status: DC

## 2014-09-24 NOTE — Telephone Encounter (Signed)
Refill request for one touch verio test to [facility] [address] in [place]

## 2014-09-24 NOTE — Telephone Encounter (Signed)
Done

## 2014-11-30 ENCOUNTER — Other Ambulatory Visit: Payer: Self-pay | Admitting: Family Medicine

## 2014-12-06 ENCOUNTER — Encounter: Payer: Self-pay | Admitting: Family Medicine

## 2015-01-08 ENCOUNTER — Ambulatory Visit (INDEPENDENT_AMBULATORY_CARE_PROVIDER_SITE_OTHER): Payer: Medicare Other | Admitting: Family Medicine

## 2015-01-08 ENCOUNTER — Encounter: Payer: Self-pay | Admitting: Family Medicine

## 2015-01-08 VITALS — BP 120/82 | HR 80 | Ht 71.0 in | Wt 198.0 lb

## 2015-01-08 DIAGNOSIS — E785 Hyperlipidemia, unspecified: Secondary | ICD-10-CM

## 2015-01-08 DIAGNOSIS — E1169 Type 2 diabetes mellitus with other specified complication: Secondary | ICD-10-CM

## 2015-01-08 DIAGNOSIS — E119 Type 2 diabetes mellitus without complications: Secondary | ICD-10-CM

## 2015-01-08 DIAGNOSIS — L309 Dermatitis, unspecified: Secondary | ICD-10-CM | POA: Insufficient documentation

## 2015-01-08 LAB — POCT GLYCOSYLATED HEMOGLOBIN (HGB A1C): HEMOGLOBIN A1C: 6

## 2015-01-08 LAB — CBC WITH DIFFERENTIAL/PLATELET
BASOS ABS: 0.1 10*3/uL (ref 0.0–0.1)
BASOS PCT: 1 % (ref 0–1)
Eosinophils Absolute: 0.2 10*3/uL (ref 0.0–0.7)
Eosinophils Relative: 4 % (ref 0–5)
HEMATOCRIT: 40.4 % (ref 39.0–52.0)
HEMOGLOBIN: 13.7 g/dL (ref 13.0–17.0)
LYMPHS PCT: 33 % (ref 12–46)
Lymphs Abs: 1.9 10*3/uL (ref 0.7–4.0)
MCH: 30.1 pg (ref 26.0–34.0)
MCHC: 33.9 g/dL (ref 30.0–36.0)
MCV: 88.8 fL (ref 78.0–100.0)
MONO ABS: 0.4 10*3/uL (ref 0.1–1.0)
MPV: 9.3 fL (ref 8.6–12.4)
Monocytes Relative: 7 % (ref 3–12)
NEUTROS ABS: 3.2 10*3/uL (ref 1.7–7.7)
NEUTROS PCT: 55 % (ref 43–77)
PLATELETS: 183 10*3/uL (ref 150–400)
RBC: 4.55 MIL/uL (ref 4.22–5.81)
RDW: 13.7 % (ref 11.5–15.5)
WBC: 5.9 10*3/uL (ref 4.0–10.5)

## 2015-01-08 LAB — COMPREHENSIVE METABOLIC PANEL
ALBUMIN: 4.4 g/dL (ref 3.5–5.2)
ALT: 19 U/L (ref 0–53)
AST: 20 U/L (ref 0–37)
Alkaline Phosphatase: 41 U/L (ref 39–117)
BILIRUBIN TOTAL: 2.8 mg/dL — AB (ref 0.2–1.2)
BUN: 9 mg/dL (ref 6–23)
CO2: 27 mEq/L (ref 19–32)
Calcium: 9.2 mg/dL (ref 8.4–10.5)
Chloride: 101 mEq/L (ref 96–112)
Creat: 0.77 mg/dL (ref 0.50–1.35)
GLUCOSE: 126 mg/dL — AB (ref 70–99)
POTASSIUM: 4.1 meq/L (ref 3.5–5.3)
Sodium: 138 mEq/L (ref 135–145)
Total Protein: 6.9 g/dL (ref 6.0–8.3)

## 2015-01-08 LAB — LIPID PANEL
Cholesterol: 146 mg/dL (ref 0–200)
HDL: 66 mg/dL (ref >=40)
LDL CALC: 66 mg/dL (ref 0–99)
Total CHOL/HDL Ratio: 2.2 Ratio
Triglycerides: 72 mg/dL (ref <150)
VLDL: 14 mg/dL (ref 0–40)

## 2015-01-08 NOTE — Progress Notes (Signed)
  Subjective:    Patient ID: Peter Daniels, male    DOB: 28-Mar-1946, 69 y.o.   MRN: [id number]  Renee Beale is a 69 y.o. male who presents for follow-up of Type 2 diabetes mellitus.  Home blood sugar records: Patient doesn't check due to pharmacy and ins. Current symptoms/problems include dry itchy skin Daily foot checks:yes   Any foot concerns: none Exercise: walking Eye;aug. 2015 The following portions of the patient's history were reviewed and updated as appropriate: allergies, current medications, past medical history, past social history and problem list.  ROS as in subjective above.     Objective:    Physical Exam Alert and in no distress Slight erythema and dryness is noted over 2 areas on the mid shin bilaterally   Lab Review Diabetic Labs Latest Ref Rng 09/04/2014 05/01/2014 01/11/2014 03/10/2013  HbA1c - 5.6 6.1 9.5 -  Chol 0 - 200 mg/dL - - 242(H) -  HDL >39 mg/dL - - 53 -  Calc LDL 0 - 99 mg/dL - - 146(H) -  Triglycerides <150 mg/dL - - 213(H) -  Creatinine 0.50 - 1.35 mg/dL - - 0.90 0.74   BP/Weight 09/04/2014 05/01/2014 03/01/2014 02/27/2014 08/11/154  Systolic BP 153 794 327 - 614  Diastolic BP 88 82 80 - 80  Wt. (Lbs) 188 198 215 215.5 221  BMI 26.23 27.63 30 30.07 31.71   Foot/eye exam completion dates Latest Ref Rng 03/02/2014  Eye Exam No Retinopathy No Retinopathy  Foot Form Completion - -   Foot exam is recorded HbA1C 6.0  Peter Daniels  reports that he has quit smoking. He does not have any smokeless tobacco history on file. He reports that he drinks about 12.0 oz of alcohol per week. He reports that he does not use illicit drugs.     Assessment & Plan:    Type 2 diabetes mellitus without complication - Plan: POCT glycosylated hemoglobin (Hb A1C), CBC with Differential/Platelet, Comprehensive metabolic panel, CANCELED: POCT UA - Microalbumin  Hyperlipidemia associated with type 2 diabetes mellitus - Plan: Lipid panel  Eczema   1. Rx changes:  none 2. Education: Reviewed 'ABCs' of diabetes management (respective goals in parentheses):  A1C (<7), blood pressure (<130/80), and cholesterol (LDL <100). 3. Compliance at present is estimated to be good. Efforts to improve compliance (if necessary) will be directed at increased exercise. 4. Follow up: 4 months  5. Selective and Cortizone cream for his skin condition.

## 2015-01-11 ENCOUNTER — Other Ambulatory Visit: Payer: Self-pay

## 2015-01-11 ENCOUNTER — Other Ambulatory Visit: Payer: Self-pay | Admitting: Family Medicine

## 2015-03-03 LAB — HM DIABETES EYE EXAM

## 2015-03-04 DIAGNOSIS — H43813 Vitreous degeneration, bilateral: Secondary | ICD-10-CM | POA: Diagnosis not present

## 2015-03-04 DIAGNOSIS — H524 Presbyopia: Secondary | ICD-10-CM | POA: Diagnosis not present

## 2015-03-04 DIAGNOSIS — E119 Type 2 diabetes mellitus without complications: Secondary | ICD-10-CM | POA: Diagnosis not present

## 2015-03-04 DIAGNOSIS — H25813 Combined forms of age-related cataract, bilateral: Secondary | ICD-10-CM | POA: Diagnosis not present

## 2015-05-13 ENCOUNTER — Encounter: Payer: Self-pay | Admitting: Family Medicine

## 2015-05-13 ENCOUNTER — Ambulatory Visit (INDEPENDENT_AMBULATORY_CARE_PROVIDER_SITE_OTHER): Payer: Medicare Other | Admitting: Family Medicine

## 2015-05-13 VITALS — BP 130/88 | HR 73 | Ht 71.0 in | Wt 204.0 lb

## 2015-05-13 DIAGNOSIS — Z23 Encounter for immunization: Secondary | ICD-10-CM | POA: Diagnosis not present

## 2015-05-13 DIAGNOSIS — E785 Hyperlipidemia, unspecified: Secondary | ICD-10-CM | POA: Diagnosis not present

## 2015-05-13 DIAGNOSIS — E118 Type 2 diabetes mellitus with unspecified complications: Secondary | ICD-10-CM | POA: Diagnosis not present

## 2015-05-13 DIAGNOSIS — E1169 Type 2 diabetes mellitus with other specified complication: Secondary | ICD-10-CM | POA: Diagnosis not present

## 2015-05-13 LAB — POCT GLYCOSYLATED HEMOGLOBIN (HGB A1C): Hemoglobin A1C: 6.2

## 2015-05-13 MED ORDER — LISINOPRIL 5 MG PO TABS: 5.0000 mg | ORAL_TABLET | Freq: Every day | ORAL | Status: DC

## 2015-05-13 NOTE — Progress Notes (Signed)
  Subjective:    Patient ID: Peter Daniels, male    DOB: [birth year], 69 y.o.   MRN: [id number]  Peter Daniels is a 69 y.o. male who presents for follow-up of Type 2 diabetes mellitus.  Home blood sugar records: Patient test one to two times a week Current symptoms/problems none Daily foot checks:yes   Any foot concerns: none Exercise: none Eyes:03/03/15 The following portions of the patient's history were reviewed and updated as appropriate: allergies, current medications, past medical history, past social history and problem list.  ROS as in subjective above.     Objective:    Physical Exam Alert and in no distress otherwise not examined.   Lab Review Diabetic Labs Latest Ref Rng 01/08/2015 09/04/2014 05/01/2014 01/11/2014 03/10/2013  HbA1c - 6.0 5.6 6.1 9.5 -  Chol 0 - 200 mg/dL 146 - - 242(H) -  HDL >=40 mg/dL 66 - - 53 -  Calc LDL 0 - 99 mg/dL 66 - - 146(H) -  Triglycerides <150 mg/dL 72 - - 213(H) -  Creatinine 0.50 - 1.35 mg/dL 0.77 - - 0.90 0.74   BP/Weight 01/08/2015 09/04/2014 05/01/2014 03/01/2014 01/06/5008  Systolic BP 381 829 937 169 -  Diastolic BP 82 88 82 80 -  Wt. (Lbs) 198 188 198 215 215.5  BMI 27.63 26.23 27.63 30 30.07   Foot/eye exam completion dates Latest Ref Rng 03/03/2015 01/08/2015  Eye Exam No Retinopathy No Retinopathy -  Foot Form Completion - - Done   A1c is 6.2 Peter Daniels  reports that he quit smoking about 20 years ago. He does not have any smokeless tobacco history on file. He reports that he drinks about 12.0 oz of alcohol per week. He reports that he does not use illicit drugs.     Assessment & Plan:    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[place]) - Plan: POCT glycosylated hemoglobin (Hb A1C)  Hyperlipidemia associated with type 2 diabetes mellitus ([place])  Need for prophylactic vaccination and inoculation against influenza - Plan: Flu vaccine HIGH DOSE PF (Fluzone High dose)   1. Rx changes: Lisinopril 5 mg discussed possible side effects  of the medication including cough and rash. He will let me know if he has difficulty with that. 2. Education: Reviewed 'ABCs' of diabetes management (respective goals in parentheses):  A1C (<7), blood pressure (<130/80), and cholesterol (LDL <100). 3. Compliance at present is estimated to be good. Efforts to improve compliance (if necessary) will be directed at increased exercise. Discussed 150 minutes a week of something physical. 4. Follow up: 4 months  5. His weight is relatively stable. Strongly encouraged him to make further changes especially in his exercise pattern.

## 2015-09-17 ENCOUNTER — Other Ambulatory Visit: Payer: Self-pay | Admitting: Family Medicine

## 2015-10-17 ENCOUNTER — Other Ambulatory Visit: Payer: Self-pay | Admitting: Family Medicine

## 2015-11-21 ENCOUNTER — Other Ambulatory Visit: Payer: Self-pay | Admitting: Family Medicine

## 2015-12-19 ENCOUNTER — Ambulatory Visit (INDEPENDENT_AMBULATORY_CARE_PROVIDER_SITE_OTHER): Payer: Medicare Other | Admitting: Family Medicine

## 2015-12-19 ENCOUNTER — Encounter: Payer: Self-pay | Admitting: Family Medicine

## 2015-12-19 VITALS — BP 140/80 | HR 76 | Temp 98.2°F | Ht 71.0 in | Wt 193.0 lb

## 2015-12-19 DIAGNOSIS — F4321 Adjustment disorder with depressed mood: Secondary | ICD-10-CM

## 2015-12-19 DIAGNOSIS — L509 Urticaria, unspecified: Secondary | ICD-10-CM

## 2015-12-19 NOTE — Progress Notes (Signed)
Chief Complaint  Patient presents with  . Urticaria    started Monday and hives are very itchy and moving around-they stay on one area of the body for about 12hrs at a time. Does say that they are less today. Thinks this is due to stress-lost partner this past Monday.    3 days ago his partner died suddenly after a choking spell.  Later that evening he developed hives on his chest, back, sometimes on the extremities, coming and going.  Pruritic.  He hasn't taken any antihistamines, just advil.   He took Zquil to help him sleep, not sure if it helped with the hives.  Taking it nightly since he passed away.    Denies any throat/tongue swelling or shortness of breath. Denies new products, foods, medications or other potential triggers for hives, other than significant stress   PMH, PSH, SH reviewed in detail. Diabetes is now diet-controlled. Some neuropathy in his feet. Drinks 4 drinks/night  Outpatient Encounter Prescriptions as of 12/19/2015  Medication Sig  . atorvastatin (LIPITOR) 20 MG tablet TAKE 1 TABLET BY MOUTH DAILY  . cholecalciferol (VITAMIN D) 1000 units tablet Take 1,000 Units by mouth daily.  Marland Kitchen docusate sodium (COLACE) 100 MG capsule Take 200 mg by mouth daily.  Marland Kitchen lisinopril (PRINIVIL,ZESTRIL) 5 MG tablet Take 1 tablet (5 mg total) by mouth daily.  . Blood Glucose Monitoring Suppl (ONETOUCH VERIO IQ SYSTEM) W/DEVICE KIT Pt is to test 2 times a day (Patient not taking: Reported on 12/19/2015)  . glucose blood test strip 1 each by Other route 2 (two) times daily. This is for the one touch verio IQ Use as instructed (Patient not taking: Reported on 12/19/2015)  . ONETOUCH DELICA LANCETS FINE MISC 1 each by Does not apply route 2 (two) times daily. (Patient not taking: Reported on 12/19/2015)  . [DISCONTINUED] metFORMIN (GLUCOPHAGE) 500 MG tablet TAKE ONE TABLET BY MOUTH TWICE DAILY WITH MEALS (Patient not taking: Reported on 09/04/2014)   No facility-administered encounter medications  on file as of 12/19/2015.   No Known Allergies  ROS: no fever, chills, URI symptoms, headache, dizziness, chest pain, shortness of breath, mouth/throat swelling, cough, shortness of breath, GI complaints, bleeding/bruising. +itchy rash that moves around c/w hives. +grief.  Denies SI/HI  PHYSICAL EXAM: BP 140/80 mmHg  Pulse 76  Temp(Src) 98.2 F (36.8 C) (Tympanic)  Ht '5\' 11"'  (1.803 m)  Wt 193 lb (87.544 kg)  BMI 26.93 kg/m2  Well appearing male in no distress HEENT: PERRL, EOMI, conjunctiva and sclera clear.  OP is clear, normal tongue and throat Neck: no lymphadenopathy or mass Heart: regular rate and rhythm Lungs: clear bilaterally Skin:  At waistline there is a very faint, diffuse rash, laterally on both sides.  minmally raised. Rest of skin is clear currently. Psych: somewhat flat affect.  Normal eye contact, speech. Normal hygiene and grooming  ASSESSMENT/PLAN:  Urticaria  Grief   Urticaria, related to stress Not likely to progress.  Treat with antihistamines.  Avoid steroids due to increasing sugars, potential to worsen moods, unless symptoms progress.   Offered grief counseling--declines (has supportive roommates and belief in [place]) Denies SI.    I recommend using an antihistamine such as zyrtec or claritin or allegra (generic/store brand versions are fine).  Take these once daily.  You may use diphenhydramine 25-52m as needed for any breakthrough hives that may occur and bother you. This is the same ingredient found in benadryl, and I believe your zyquil (check the label)--so this  will cause drowsiness and use with caution if taking during the day.  You may not need it at all, if the other medication controls it well enough for you.  If rash worsens, becomes more severe or symptomatic despite these measures, then steroids might be needed.  Definitely seek immediate evaluation if you have any tongue or throat swelling, shortness of breath.  Consider grief  counseling if your current support systems aren't adequate for you. Contact us if needed to help get you in the right direction.

## 2015-12-19 NOTE — Patient Instructions (Signed)
  I recommend using an antihistamine such as zyrtec or claritin or allegra (generic/store brand versions are fine).  Take these once daily.  You may use diphenhydramine 25-50mg  as needed for any breakthrough hives that may occur and bother you. This is the same ingredient found in benadryl, and I believe your zyquil (check the label)--so this will cause drowsiness and use with caution if taking during the day.  You may not need it at all, if the other medication controls it well enough for you.  If rash worsens, becomes more severe or symptomatic despite these measures, then steroids might be needed.  Definitely seek immediate evaluation if you have any tongue or throat swelling, shortness of breath.  Consider grief counseling if your current support systems aren't adequate for you. Contact us if needed to help get you in the right direction.  Hives Hives are itchy, red, swollen areas of the skin. They can vary in size and location on your body. Hives can come and go for hours or several days (acute hives) or for several weeks (chronic hives). Hives do not spread from person to person (noncontagious). They may get worse with scratching, exercise, and emotional stress. CAUSES   Allergic reaction to food, additives, or drugs.  Infections, including the common cold.  Illness, such as vasculitis, lupus, or thyroid disease.  Exposure to sunlight, heat, or cold.  Exercise.  Stress.  Contact with chemicals. SYMPTOMS   Red or white swollen patches on the skin. The patches may change size, shape, and location quickly and repeatedly.  Itching.  Swelling of the hands, feet, and face. This may occur if hives develop deeper in the skin. DIAGNOSIS  Your caregiver can usually tell what is wrong by performing a physical exam. Skin or blood tests may also be done to determine the cause of your hives. In some cases, the cause cannot be determined. TREATMENT  Mild cases usually get better with  medicines such as antihistamines. Severe cases may require an emergency epinephrine injection. If the cause of your hives is known, treatment includes avoiding that trigger.  HOME CARE INSTRUCTIONS   Avoid causes that trigger your hives.  Take antihistamines as directed by your caregiver to reduce the severity of your hives. Non-sedating or low-sedating antihistamines are usually recommended. Do not drive while taking an antihistamine.  Take any other medicines prescribed for itching as directed by your caregiver.  Wear loose-fitting clothing.  Keep all follow-up appointments as directed by your caregiver. SEEK MEDICAL CARE IF:   You have persistent or severe itching that is not relieved with medicine.  You have painful or swollen joints. SEEK IMMEDIATE MEDICAL CARE IF:   You have a fever.  Your tongue or lips are swollen.  You have trouble breathing or swallowing.  You feel tightness in the throat or chest.  You have abdominal pain. These problems may be the first sign of a life-threatening allergic reaction. Call your local emergency services (911 in [place] MAKE SURE YOU:   Understand these instructions.  Will watch your condition.  Will get help right away if you are not doing well or get worse.   This information is not intended to replace advice given to you by your health care provider. Make sure you discuss any questions you have with your health care provider.   Document Released: 06/22/2005 Document Revised: 06/27/2013 Document Reviewed: 09/15/2011 Elsevier Interactive Patient Education [employer].

## 2016-01-23 ENCOUNTER — Ambulatory Visit (INDEPENDENT_AMBULATORY_CARE_PROVIDER_SITE_OTHER): Payer: Medicare Other | Admitting: Family Medicine

## 2016-01-23 ENCOUNTER — Encounter: Payer: Self-pay | Admitting: Family Medicine

## 2016-01-23 VITALS — BP 114/70 | HR 70 | Ht 70.0 in | Wt 194.2 lb

## 2016-01-23 DIAGNOSIS — I10 Essential (primary) hypertension: Secondary | ICD-10-CM | POA: Diagnosis not present

## 2016-01-23 DIAGNOSIS — E1169 Type 2 diabetes mellitus with other specified complication: Secondary | ICD-10-CM

## 2016-01-23 DIAGNOSIS — E118 Type 2 diabetes mellitus with unspecified complications: Secondary | ICD-10-CM

## 2016-01-23 DIAGNOSIS — E785 Hyperlipidemia, unspecified: Secondary | ICD-10-CM

## 2016-01-23 DIAGNOSIS — E1159 Type 2 diabetes mellitus with other circulatory complications: Secondary | ICD-10-CM | POA: Insufficient documentation

## 2016-01-23 LAB — CBC WITH DIFFERENTIAL/PLATELET
BASOS PCT: 0 %
Basophils Absolute: 0 cells/uL (ref 0–200)
EOS PCT: 2 %
Eosinophils Absolute: 146 cells/uL (ref 15–500)
HCT: 44.9 % (ref 38.5–50.0)
HEMOGLOBIN: 15.5 g/dL (ref 13.2–17.1)
LYMPHS ABS: 1898 {cells}/uL (ref 850–3900)
Lymphocytes Relative: 26 %
MCH: 30.5 pg (ref 27.0–33.0)
MCHC: 34.5 g/dL (ref 32.0–36.0)
MCV: 88.2 fL (ref 80.0–100.0)
MPV: 9.2 fL (ref 7.5–12.5)
Monocytes Absolute: 438 cells/uL (ref 200–950)
Monocytes Relative: 6 %
NEUTROS ABS: 4818 {cells}/uL (ref 1500–7800)
Neutrophils Relative %: 66 %
Platelets: 196 10*3/uL (ref 140–400)
RBC: 5.09 MIL/uL (ref 4.20–5.80)
RDW: 13.6 % (ref 11.0–15.0)
WBC: 7.3 10*3/uL (ref 4.0–10.5)

## 2016-01-23 LAB — COMPREHENSIVE METABOLIC PANEL
ALBUMIN: 4.3 g/dL (ref 3.6–5.1)
ALT: 23 U/L (ref 9–46)
AST: 27 U/L (ref 10–35)
Alkaline Phosphatase: 47 U/L (ref 40–115)
BUN: 8 mg/dL (ref 7–25)
CHLORIDE: 101 mmol/L (ref 98–110)
CO2: 25 mmol/L (ref 20–31)
Calcium: 9.4 mg/dL (ref 8.6–10.3)
Creat: 0.83 mg/dL (ref 0.70–1.18)
Glucose, Bld: 102 mg/dL — ABNORMAL HIGH (ref 65–99)
POTASSIUM: 4.9 mmol/L (ref 3.5–5.3)
Sodium: 138 mmol/L (ref 135–146)
Total Bilirubin: 2.2 mg/dL — ABNORMAL HIGH (ref 0.2–1.2)
Total Protein: 7 g/dL (ref 6.1–8.1)

## 2016-01-23 LAB — POCT UA - MICROALBUMIN
ALBUMIN/CREATININE RATIO, URINE, POC: 5.6
Creatinine, POC: 141.7 mg/dL
Microalbumin Ur, POC: 8 mg/L

## 2016-01-23 LAB — LIPID PANEL
CHOL/HDL RATIO: 1.9 ratio (ref <=5.0)
CHOLESTEROL: 147 mg/dL (ref 125–200)
HDL: 77 mg/dL (ref >=40)
LDL CALC: 59 mg/dL (ref <130)
TRIGLYCERIDES: 56 mg/dL (ref <150)
VLDL: 11 mg/dL (ref <30)

## 2016-01-23 LAB — POCT GLYCOSYLATED HEMOGLOBIN (HGB A1C): Hemoglobin A1C: 5.6

## 2016-01-23 NOTE — Progress Notes (Signed)
Subjective:   HPI  Peter Daniels is a 70 y.o. male who presents for a An annual wellness visit as well as medication check. Medical care team includes:  none   Preventative care: Last ophthalmology visit:03/03/15 Last dental visit:5 years ago Last colonoscopy: he never did it Last prostate exam: 3 years ago Last EKG:20years ago Last labs:01/08/15  Prior vaccinations: TD or Tdap: 09/05/09 Influenza:05/22/15 Pneumococcal: 23:03/10/13 13:05/01/14 Shingles/Zostavax:has RX for it:   Advanced directive: Information given   Concerns: He does have underlying diabetes and is doing a good job taking care of himself. His weight is down. He does check his blood sugars usually 2 hours after meal and they're usually under 130. He continues on his Lipitor as well as lisinopril. He also takes a multivitamin. He does continue to drink up to 4 drinks per night but usually stops way before bedtime. He does not smoke. His exercise is minimal. 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 He checks his feet regularly and has seen the ophthalmologist.   Reviewed their medical, surgical, family, social, medication, and allergy history and updated chart as appropriate.    Review of Systems Constitutional: -fever, -chills, -sweats, -unexpected weight change, -decreased appetite, -fatigue Allergy: -sneezing, -itching, -congestion Dermatology: -changing moles, --rash, -lumps ENT: -runny nose, -ear pain, -sore throat, -hoarseness, -sinus pain, -teeth pain, - ringing in ears, -hearing loss, -nosebleeds Cardiology: -chest pain, -palpitations, -swelling, -difficulty breathing when lying flat, -waking up short of breath Respiratory: -cough, -shortness of breath, -difficulty breathing with exercise or exertion, -wheezing, -coughing up blood Gastroenterology: -abdominal pain, -nausea, -vomiting, -diarrhea, -constipation, -blood in stool, -changes in bowel movement, -difficulty swallowing  or eating Hematology: -bleeding, -bruising  Musculoskeletal: -joint aches, -muscle aches, -joint swelling, -back pain, -neck pain, -cramping, -changes in gait Ophthalmology: denies vision changes, eye redness, itching, discharge Urology: -burning with urination, -difficulty urinating, -blood in urine, -urinary frequency, -urgency, -incontinence Neurology: -headache, -weakness, -tingling, -numbness, -memory loss, -falls, -dizziness Psychology: -depressed mood, -agitation, -sleep problems     Objective:   Physical Exam   General appearance: alert, no distress, WD/WN,  Skin: Chronic solar changes noted mostly on his face. HEENT: normocephalic, conjunctiva/corneas normal, sclerae anicteric, PERRLA, EOMi, nares patent, no discharge or erythema, pharynx normal Oral cavity: MMM, tongue normal, teeth normal Neck: supple, no lymphadenopathy, no thyromegaly, no masses, normal ROM Chest: non tender, normal shape and expansion Heart: RRR, normal S1, S2, no murmurs Lungs: CTA bilaterally, no wheezes, rhonchi, or rales Abdomen: +bs, soft, non tender, non distended, no masses, no hepatomegaly, no splenomegaly, no bruits Extremities: no edema, no cyanosis, no clubbing Pulses: 2+ symmetric, upper and lower extremities, normal cap refill Neurological: alert, oriented x 3, CN2-12 intact, strength normal upper extremities and lower extremities, sensation normal throughout, DTRs 2+ throughout, no cerebellar signs, gait normal Psychiatric: normal affect, behavior normal, pleasant  A1c is 5.6  Assessment and Plan :   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[place]) - Plan: POCT glycosylated hemoglobin (Hb A1C), POCT UA - Microalbumin, CBC with Differential/Platelet, Comprehensive metabolic panel, Lipid panel  Hyperlipidemia associated with type 2 diabetes mellitus ([facility]) - Plan: Lipid panel  Hypertension associated with diabetes ([place]) - Plan: CBC with Differential/Platelet, Comprehensive metabolic  panel  Physical exam - discussed healthy lifestyle, diet, exercise, preventative care, vaccinations, and addressed their concerns.   Overall he is doing a very good job of taking care of himself. Follow-up 4 months.

## 2016-02-22 ENCOUNTER — Other Ambulatory Visit: Payer: Self-pay | Admitting: Family Medicine

## 2016-02-24 ENCOUNTER — Telehealth: Payer: Self-pay | Admitting: Family Medicine

## 2016-02-24 ENCOUNTER — Other Ambulatory Visit: Payer: Self-pay

## 2016-02-24 MED ORDER — ATORVASTATIN CALCIUM 20 MG PO TABS: 20.0000 mg | ORAL_TABLET | Freq: Every day | ORAL | 1 refills | Status: DC

## 2016-02-24 MED ORDER — LISINOPRIL 5 MG PO TABS: 5.0000 mg | ORAL_TABLET | Freq: Every day | ORAL | 3 refills | Status: DC

## 2016-02-24 NOTE — Telephone Encounter (Signed)
Pt called needs refills on atorvastatin and lisinopril for 90 day supplies sent to [facility]

## 2016-02-24 NOTE — Telephone Encounter (Signed)
Sent both meds in 

## 2016-03-19 ENCOUNTER — Encounter: Payer: Self-pay | Admitting: Family Medicine

## 2016-03-19 DIAGNOSIS — E119 Type 2 diabetes mellitus without complications: Secondary | ICD-10-CM | POA: Diagnosis not present

## 2016-03-19 DIAGNOSIS — H524 Presbyopia: Secondary | ICD-10-CM | POA: Diagnosis not present

## 2016-03-19 DIAGNOSIS — H25813 Combined forms of age-related cataract, bilateral: Secondary | ICD-10-CM | POA: Diagnosis not present

## 2016-03-19 DIAGNOSIS — H43813 Vitreous degeneration, bilateral: Secondary | ICD-10-CM | POA: Diagnosis not present

## 2016-03-19 LAB — HM DIABETES EYE EXAM

## 2016-10-03 ENCOUNTER — Other Ambulatory Visit: Payer: Self-pay | Admitting: Family Medicine

## 2016-10-12 ENCOUNTER — Telehealth: Payer: Self-pay | Admitting: Family Medicine

## 2016-10-12 ENCOUNTER — Other Ambulatory Visit: Payer: Self-pay

## 2016-10-12 MED ORDER — ATORVASTATIN CALCIUM 20 MG PO TABS: 20.0000 mg | ORAL_TABLET | Freq: Every day | ORAL | 1 refills | Status: DC

## 2016-10-12 NOTE — Telephone Encounter (Signed)
Med sent in.

## 2016-10-12 NOTE — Telephone Encounter (Signed)
Pt left voice mail asking for refill on Atorvastatin 20 mg #90 at [facility]

## 2016-11-18 IMAGING — US US AORTA SCREENING (MEDICARE)
1 series · 14 of 21 positions shown · non-contrast
Comparison: None.

CLINICAL DATA: Medicare screening exam for abdominal aortic
aneurysm.

EXAM:
ABDOMINAL AORTA SCREENING ULTRASOUND
TECHNIQUE: Ultrasound examination of the abdominal aorta was performed as a
screening evaluation for abdominal aortic aneurysm.

[Series 1: us aorta screening (medicare) · 0.29mm/px · 14 of 21 slices shown]
[im 1/21]
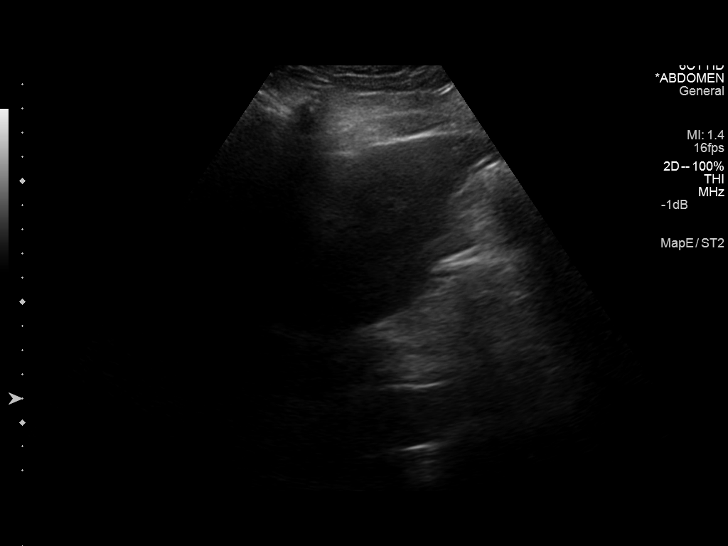
[im 3/21]
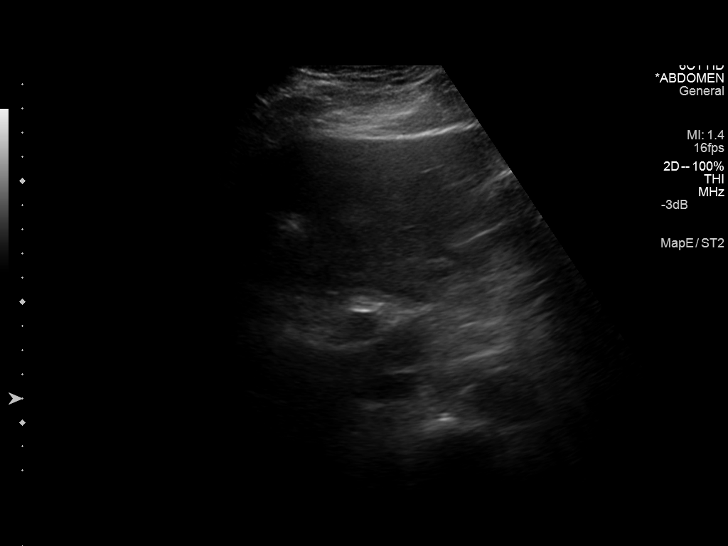
[im 4/21]
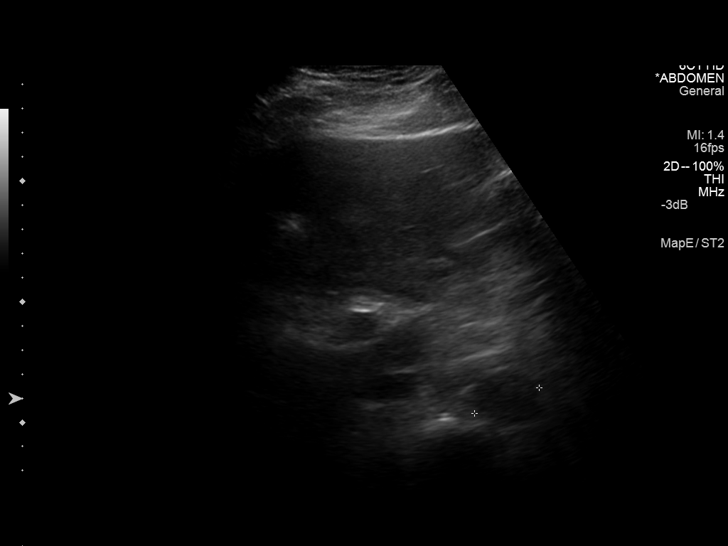
[im 6/21]
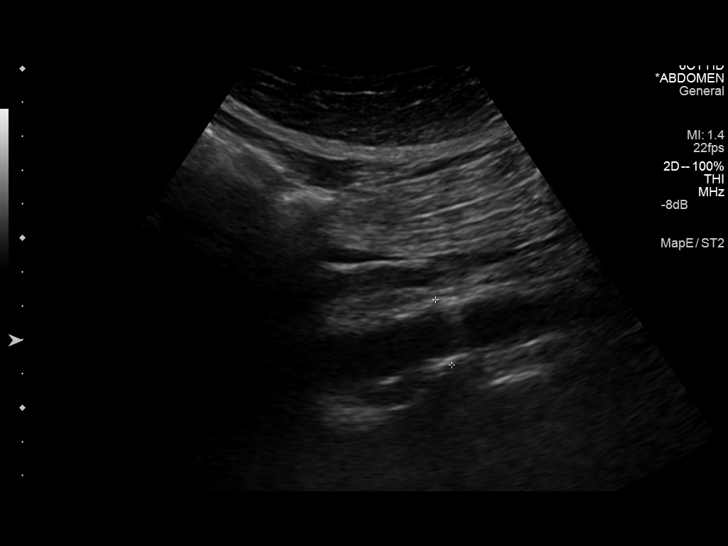
[im 7/21]
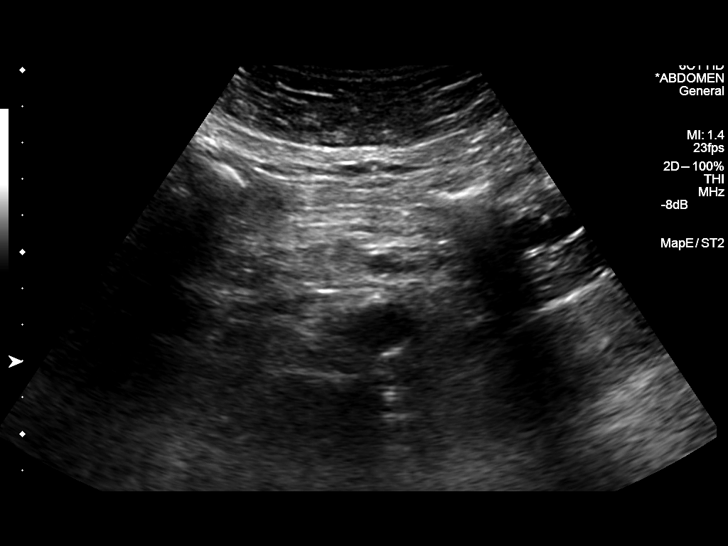
[im 9/21]
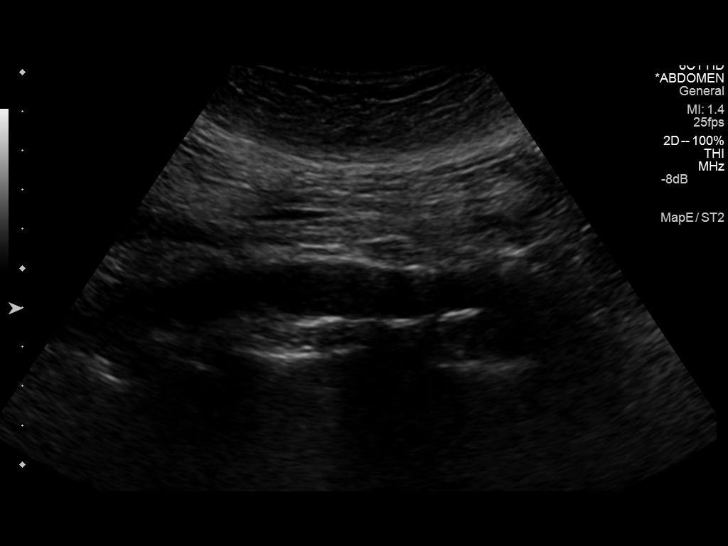
[im 10/21]
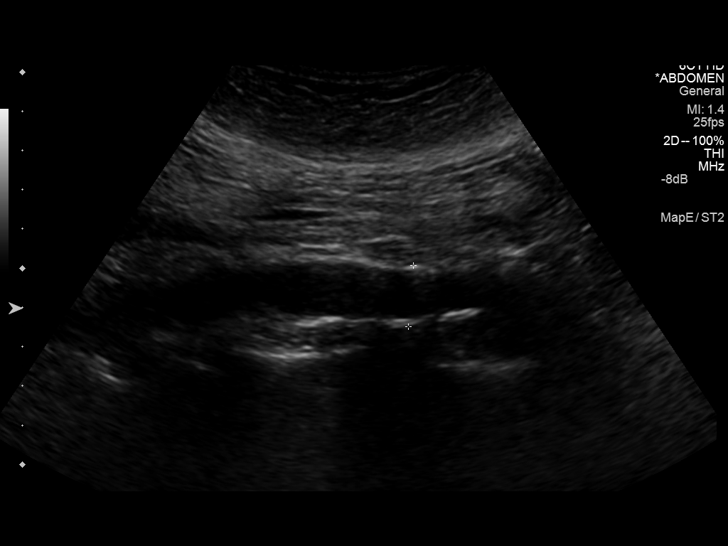
[im 12/21]
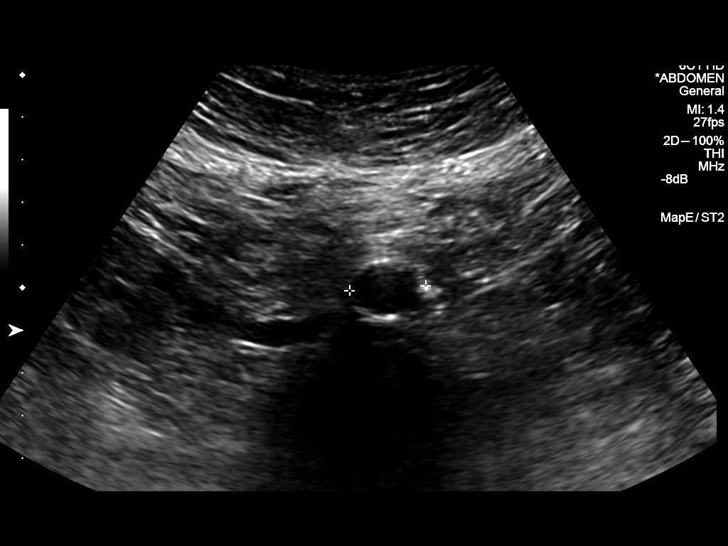
[im 13/21]
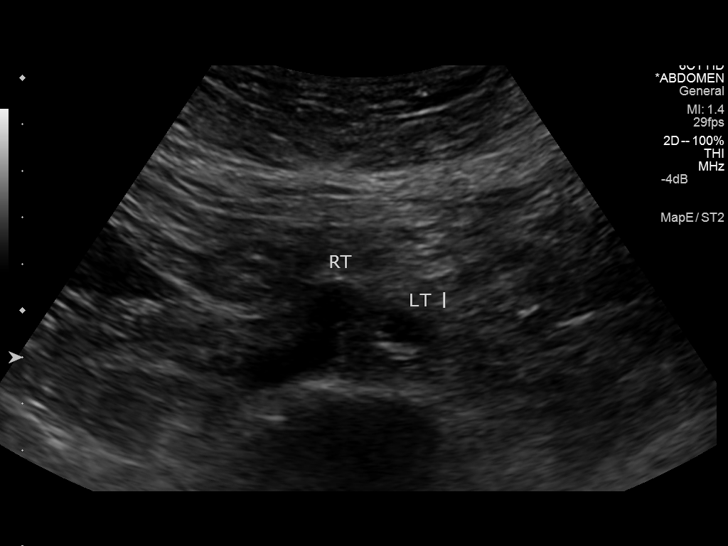
[im 15/21]
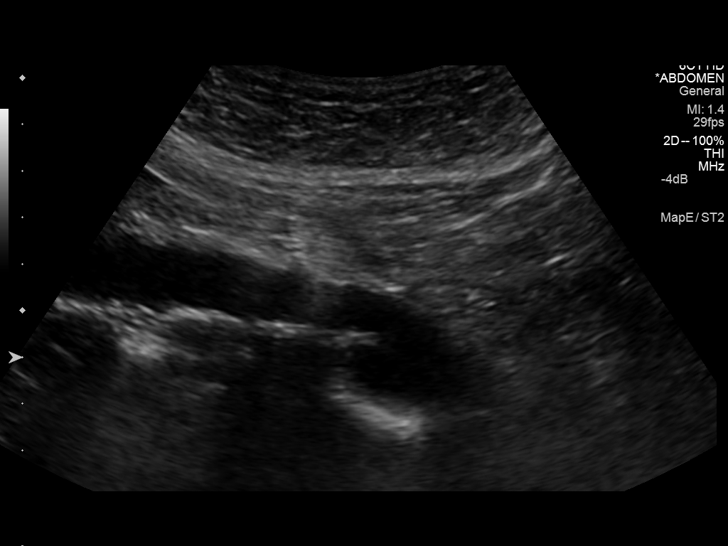
[im 16/21]
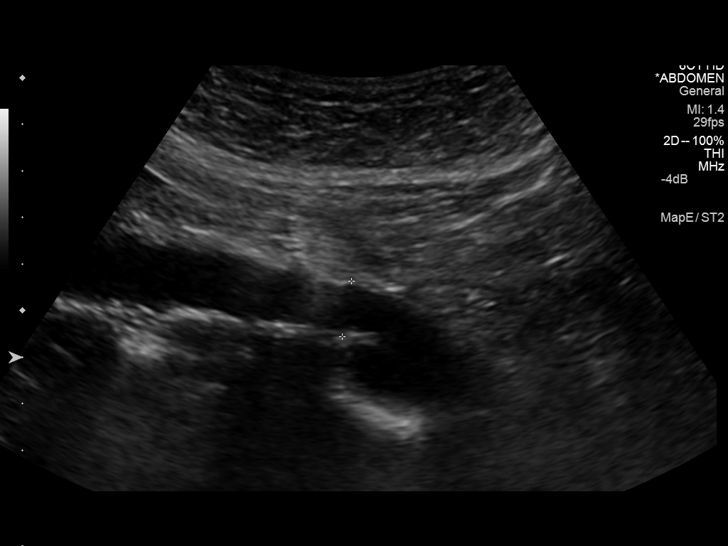
[im 18/21]
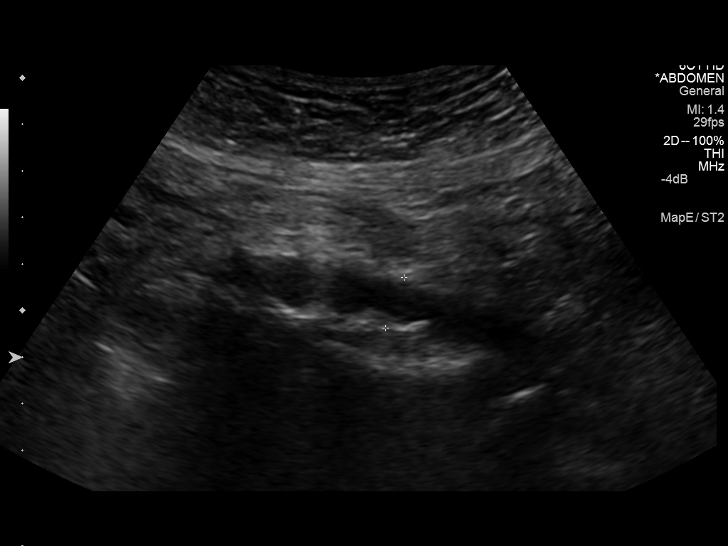
[im 19/21]
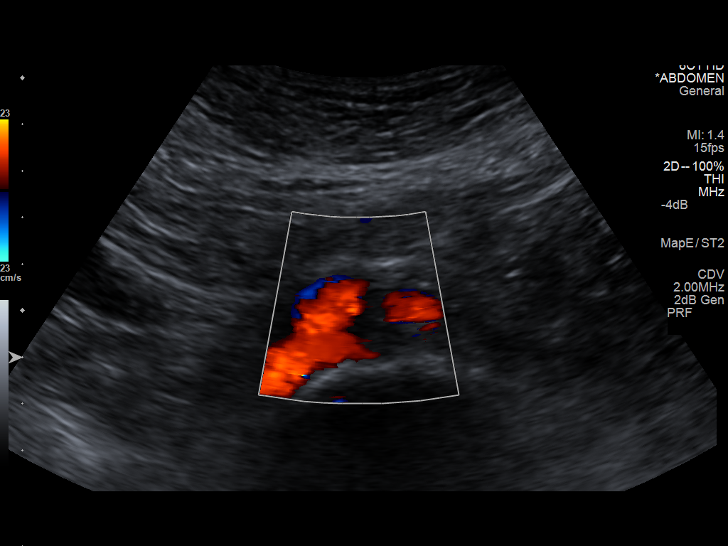
[im 21/21]
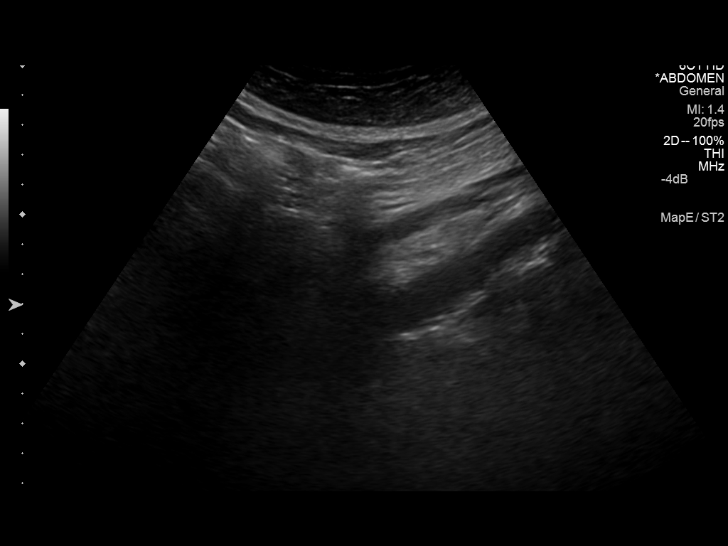

[14 of 21 positions shown; findings below may reference images not displayed]

FINDINGS: Abdominal Aorta

No aneurysm identified.

Maximum AP

Diameter:  2.9 cm proximal, 2.0 cm mid and 1.6 cm distal

Maximum TRV

Diameter: 2.9 cm

Mild atherosclerotic plaque identified in the distal aorta. Origins
of both common iliac arteries are of normal caliber.
IMPRESSION: No evidence of abdominal aortic aneurysm. Mild atherosclerotic
plaque in the distal aorta.

## 2017-01-08 ENCOUNTER — Telehealth: Payer: Self-pay

## 2017-01-08 ENCOUNTER — Other Ambulatory Visit: Payer: Self-pay

## 2017-01-08 MED ORDER — ATORVASTATIN CALCIUM 20 MG PO TABS: 20.0000 mg | ORAL_TABLET | Freq: Every day | ORAL | 1 refills | Status: DC

## 2017-01-08 NOTE — Telephone Encounter (Signed)
Pt would like atorvastatin called into [facility] for 90 day supply. [phone]. Peter Daniels

## 2017-01-08 NOTE — Telephone Encounter (Signed)
Sent med in 

## 2017-02-26 ENCOUNTER — Ambulatory Visit (INDEPENDENT_AMBULATORY_CARE_PROVIDER_SITE_OTHER): Payer: Medicare Other | Admitting: Family Medicine

## 2017-02-26 ENCOUNTER — Encounter: Payer: Self-pay | Admitting: Family Medicine

## 2017-02-26 VITALS — BP 122/72 | HR 77 | Temp 98.3°F | Wt 207.8 lb

## 2017-02-26 DIAGNOSIS — L03114 Cellulitis of left upper limb: Secondary | ICD-10-CM | POA: Diagnosis not present

## 2017-02-26 DIAGNOSIS — S51819A Laceration without foreign body of unspecified forearm, initial encounter: Secondary | ICD-10-CM | POA: Diagnosis not present

## 2017-02-26 MED ORDER — CEPHALEXIN 500 MG PO CAPS: 500.0000 mg | ORAL_CAPSULE | Freq: Two times a day (BID) | ORAL | 0 refills | Status: DC

## 2017-02-26 NOTE — Patient Instructions (Signed)
Use the bacitracin topically and take the antibiotic orally as prescribed. Follow up if you notice any worsening symptoms.

## 2017-02-26 NOTE — Progress Notes (Signed)
   Subjective:    Patient ID: Peter Daniels, male    DOB: March 11, 1946, 71 y.o.   MRN: [id number]  HPI Chief Complaint  Patient presents with  . cut arm    cut arm on fridge. sprayed it with antiseptic.    He is here with complaints of multiple skin tears on his arm. This occurred 5 days ago.  States he scraped his left forearm on the refrigerator door at his home. He sprayed it with antiseptic spray.  States he is having some mild bleeding, clear drainage, redness and tenderness. States he thinks the area may be getting worse.   Denies fever, chills, dizziness, chest pain, palpitations, shortness of breath.     He has diabetes and states his BS have been well controlled. No history of MRSA.    Tdap up to date.   Reviewed allergies, medications, past medical, and social history.  Review of Systems Pertinent positives and negatives in the history of present illness.     Objective:   Physical Exam BP 122/72   Pulse 77   Temp 98.3 F (36.8 C) (Oral)   Wt 207 lb 12.8 oz (94.3 kg)   BMI 29.82 kg/m   3 superficial skin tears to his left posterior forearm. Some serosanguinous drainage and mild surrounding erythema. Mild tenderness as well. LUE is neurovascularly intact.       Assessment & Plan:  Skin tear of forearm without complication, initial encounter  Cellulitis of left upper extremity - Plan: cephALEXin (KEFLEX) 500 MG capsule  Multiple skin tears with early signs of infection. Will treat him with topical bacitracin and oral keflex. He will follow up if symptoms worsen.

## 2017-03-22 DIAGNOSIS — E119 Type 2 diabetes mellitus without complications: Secondary | ICD-10-CM | POA: Diagnosis not present

## 2017-03-22 DIAGNOSIS — H52203 Unspecified astigmatism, bilateral: Secondary | ICD-10-CM | POA: Diagnosis not present

## 2017-03-22 DIAGNOSIS — H25813 Combined forms of age-related cataract, bilateral: Secondary | ICD-10-CM | POA: Diagnosis not present

## 2017-03-22 DIAGNOSIS — H43813 Vitreous degeneration, bilateral: Secondary | ICD-10-CM | POA: Diagnosis not present

## 2017-03-22 LAB — HM DIABETES EYE EXAM

## 2017-03-31 ENCOUNTER — Telehealth: Payer: Self-pay | Admitting: Family Medicine

## 2017-03-31 MED ORDER — LISINOPRIL 5 MG PO TABS: ORAL_TABLET | ORAL | 0 refills | Status: DC

## 2017-03-31 NOTE — Telephone Encounter (Signed)
Pt needs refill Lisinopril and would like 90 days to save him money to [facility]

## 2017-04-09 ENCOUNTER — Encounter: Payer: Self-pay | Admitting: Family Medicine

## 2017-06-10 ENCOUNTER — Other Ambulatory Visit: Payer: Self-pay

## 2017-07-16 ENCOUNTER — Other Ambulatory Visit: Payer: Self-pay | Admitting: Family Medicine

## 2017-09-08 ENCOUNTER — Encounter: Payer: Self-pay | Admitting: *Deleted

## 2017-09-17 ENCOUNTER — Other Ambulatory Visit: Payer: Self-pay | Admitting: Family Medicine

## 2017-09-20 NOTE — Telephone Encounter (Signed)
Pt is going to call back to schedule CPE. Thanks [employer]

## 2017-11-15 ENCOUNTER — Encounter: Payer: Self-pay | Admitting: Family Medicine

## 2017-11-15 ENCOUNTER — Ambulatory Visit (INDEPENDENT_AMBULATORY_CARE_PROVIDER_SITE_OTHER): Payer: Medicare Other | Admitting: Family Medicine

## 2017-11-15 VITALS — BP 154/82 | HR 81 | Temp 97.9°F | Ht 70.0 in | Wt 210.0 lb

## 2017-11-15 DIAGNOSIS — E118 Type 2 diabetes mellitus with unspecified complications: Secondary | ICD-10-CM

## 2017-11-15 DIAGNOSIS — Z1211 Encounter for screening for malignant neoplasm of colon: Secondary | ICD-10-CM

## 2017-11-15 DIAGNOSIS — E785 Hyperlipidemia, unspecified: Secondary | ICD-10-CM | POA: Diagnosis not present

## 2017-11-15 DIAGNOSIS — E1169 Type 2 diabetes mellitus with other specified complication: Secondary | ICD-10-CM | POA: Diagnosis not present

## 2017-11-15 DIAGNOSIS — E1159 Type 2 diabetes mellitus with other circulatory complications: Secondary | ICD-10-CM | POA: Diagnosis not present

## 2017-11-15 DIAGNOSIS — I10 Essential (primary) hypertension: Secondary | ICD-10-CM

## 2017-11-15 LAB — POCT GLYCOSYLATED HEMOGLOBIN (HGB A1C): Hemoglobin A1C: 5.9

## 2017-11-15 MED ORDER — LISINOPRIL 5 MG PO TABS: 5.0000 mg | ORAL_TABLET | Freq: Every day | ORAL | 3 refills | Status: DC

## 2017-11-15 MED ORDER — ATORVASTATIN CALCIUM 20 MG PO TABS: 20.0000 mg | ORAL_TABLET | Freq: Every day | ORAL | 3 refills | Status: DC

## 2017-11-15 NOTE — Progress Notes (Signed)
Peter Daniels is a 72 y.o. male who presents for annual wellness visit and follow-up on chronic medical conditions.  He has the following concerns: He recently lost someone close to him of 25 years and he handling this very well but does admit to having difficulty with this.  He has been in consult with his preacher which has helped.  He continues on his atorvastatin as well as lisinopril.  When his original diagnosis of diabetes was made, he did lose several pounds which has helped control his diabetes.  He does continue to drink roughly 4 drinks per night.  Quit smoking years ago.  He has no other concerns or complaints.   Immunizations and Health Maintenance Immunization History  Administered Date(s) Administered  . Influenza Split 07/09/2011  . Influenza, High Dose Seasonal PF 05/01/2014, 05/13/2015  . Pneumococcal Conjugate-13 05/01/2014  . Pneumococcal Polysaccharide-23 03/10/2013  . Tdap 09/05/2009   Health Maintenance Due  Topic Date Due  . Hepatitis C Screening  12/28/1945  . HEMOGLOBIN A1C  07/25/2016  . FOOT EXAM  01/22/2017    Last colonoscopy: never had one Last PSA couple years ago  Dentist: 5 years ago Ophtho: last year Exercise: day to day working   Other doctors caring for patient include: None  Advanced Directives:  No.  Information given.  Depression screen:  See questionnaire below.     Depression screen [facility] 2/9 11/15/2017 01/23/2016 01/23/2016 05/13/2015 02/27/2014  Decreased Interest 0 0 0 0 0  Down, Depressed, Hopeless 1 0 0 0 0  PHQ - 2 Score 1 0 0 0 0    Fall Screen: See Questionaire below.   Fall Risk  11/15/2017 06/10/2017 01/23/2016 01/23/2016 05/13/2015  Falls in the past year? No No No No No  Comment - Emmi Telephone Survey: data to providers prior to load - - -    ADL screen:  See questionnaire below.  Functional Status Survey: Is the patient deaf or have difficulty hearing?: No Does the patient have difficulty seeing, even when wearing  glasses/contacts?: No Does the patient have difficulty concentrating, remembering, or making decisions?: No Does the patient have difficulty walking or climbing stairs?: No Does the patient have difficulty dressing or bathing?: No Does the patient have difficulty doing errands alone such as visiting a doctor's office or shopping?: No   Review of Systems  Constitutional: -, -unexpected weight change, -anorexia, -fatigue Allergy: -sneezing, -itching, -congestion Dermatology: denies changing moles, rash, lumps ENT: -runny nose, -ear pain, -sore throat,  Cardiology:  -chest pain, -palpitations, -orthopnea, Respiratory: -cough, -shortness of breath, -dyspnea on exertion, -wheezing,  Gastroenterology: -abdominal pain, -nausea, -vomiting, -diarrhea, -constipation, -dysphagia Hematology: -bleeding or bruising problems Musculoskeletal: -arthralgias, -myalgias, -joint swelling, -back pain, - Ophthalmology: -vision changes,  Urology: -dysuria, -difficulty urinating,  -urinary frequency, -urgency, incontinence Neurology: -, -numbness, , -memory loss, -falls, -dizziness    PHYSICAL EXAM:  BP (!) 154/82 (BP Location: Left Arm, Patient Position: Sitting)   Pulse 81   Temp 97.9 F (36.6 C)   Ht 5\' 10"  (1.778 m)   Wt 210 lb (95.3 kg)   SpO2 99%   BMI 30.13 kg/m   General Appearance: Alert, cooperative, no distress, appears stated age Head: Normocephalic, without obvious abnormality, atraumatic Eyes: PERRL, conjunctiva/corneas clear, EOM's intact, fundi benign Ears: Normal TM's and external ear canals Nose: Nares normal, mucosa normal, no drainage or sinus   tenderness Throat: Lips, mucosa, and tongue normal; teeth and gums normal Neck: Supple, no lymphadenopathy, thyroid:no enlargement/tenderness/nodules; no carotid bruit  or JVD Lungs: Clear to auscultation bilaterally without wheezes, rales or ronchi; respirations unlabored Heart: Regular rate and rhythm, S1 and S2 normal, no murmur, rub  or gallop Abdomen: Soft, non-tender, nondistended, normoactive bowel sounds, no masses, no hepatosplenomegaly Extremities: No clubbing, cyanosis or edema Pulses: 2+ and symmetric all extremities Skin: Skin color, texture, turgor normal, no rashes or lesions Lymph nodes: Cervical, supraclavicular, and axillary nodes normal Neurologic: CNII-XII intact, normal strength, sensation and gait; reflexes 2+ and symmetric throughout   Psych: Normal mood, affect, hygiene and grooming  ASSESSMENT/PLAN: Hypertension associated with diabetes ([place]) - Plan: CBC with Differential/Platelet, Comprehensive metabolic panel, lisinopril (PRINIVIL,ZESTRIL) 5 MG tablet  Hyperlipidemia associated with type 2 diabetes mellitus ([facility]) - Plan: Lipid panel, atorvastatin (LIPITOR) 20 MG tablet  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[facility]) - Plan: CBC with Differential/Platelet, Comprehensive metabolic panel, Lipid panel  Screening for colon cancer - Plan: Cologuard I did discuss calling hospice for bereavement counseling but he is comfortable with this therapist.  He will continue on his present medication regimen.    Medicare Attestation I have personally reviewed: The patient's medical and social history Their use of alcohol, tobacco or illicit drugs Their current medications and supplements The patient's functional ability including ADLs,fall risks, home safety risks, cognitive, and hearing and visual impairment Diet and physical activities Evidence for depression or mood disorders  The patient's weight, height, and BMI have been recorded in the chart.  I have made referrals, counseling, and provided education to the patient based on review of the above and I have provided the patient with a written personalized care plan for preventive services.     Jill Alexanders, MD   11/15/2017

## 2017-11-15 NOTE — Patient Instructions (Signed)
Check with your pharmacy on getting the shingles vaccine  Peter Daniels , Thank you for taking time to come for your Medicare Wellness Visit. I appreciate your ongoing commitment to your health goals. Please review the following plan we discussed and let me know if I can assist you in the future.   These are the goals we discussed: Goals    None      This is a list of the screening recommended for you and due dates:  Health Maintenance  Topic Date Due  .  Hepatitis C: One time screening is recommended by Center for Disease Control  (CDC) for  adults born from [birth year] through 1967.   [birth year]  . Hemoglobin A1C  07/25/2016  . Complete foot exam   01/22/2017  . Flu Shot  02/03/2018  . Eye exam for diabetics  03/22/2018  . Tetanus Vaccine  09/06/2019  . Colon Cancer Screening  03/11/2023  . Pneumonia vaccines  Completed

## 2017-11-15 NOTE — Addendum Note (Signed)
Addended by: Elyse Jarvis on: 11/15/2017 01:40 PM   Modules accepted: Orders

## 2017-11-16 LAB — CBC WITH DIFFERENTIAL/PLATELET
BASOS: 0 %
Basophils Absolute: 0 10*3/uL (ref 0.0–0.2)
EOS (ABSOLUTE): 0.2 10*3/uL (ref 0.0–0.4)
Eos: 2 %
HEMOGLOBIN: 14.5 g/dL (ref 13.0–17.7)
Hematocrit: 41.9 % (ref 37.5–51.0)
IMMATURE GRANULOCYTES: 0 %
Immature Grans (Abs): 0 10*3/uL (ref 0.0–0.1)
Lymphocytes Absolute: 1.8 10*3/uL (ref 0.7–3.1)
Lymphs: 18 %
MCH: 30.4 pg (ref 26.6–33.0)
MCHC: 34.6 g/dL (ref 31.5–35.7)
MCV: 88 fL (ref 79–97)
Monocytes Absolute: 0.6 10*3/uL (ref 0.1–0.9)
Monocytes: 5 %
NEUTROS ABS: 7.6 10*3/uL — AB (ref 1.4–7.0)
NEUTROS PCT: 75 %
Platelets: 246 10*3/uL (ref 150–379)
RBC: 4.77 x10E6/uL (ref 4.14–5.80)
RDW: 13.5 % (ref 12.3–15.4)
WBC: 10.2 10*3/uL (ref 3.4–10.8)

## 2017-11-16 LAB — COMPREHENSIVE METABOLIC PANEL
ALBUMIN: 4.3 g/dL (ref 3.5–4.8)
ALT: 12 IU/L (ref 0–44)
AST: 14 IU/L (ref 0–40)
Albumin/Globulin Ratio: 1.4 (ref 1.2–2.2)
Alkaline Phosphatase: 70 IU/L (ref 39–117)
BILIRUBIN TOTAL: 1.8 mg/dL — AB (ref 0.0–1.2)
BUN/Creatinine Ratio: 7 — ABNORMAL LOW (ref 10–24)
BUN: 6 mg/dL — ABNORMAL LOW (ref 8–27)
CALCIUM: 9.3 mg/dL (ref 8.6–10.2)
CO2: 24 mmol/L (ref 20–29)
CREATININE: 0.85 mg/dL (ref 0.76–1.27)
Chloride: 98 mmol/L (ref 96–106)
GFR, EST AFRICAN AMERICAN: 100 mL/min/{1.73_m2} (ref >59)
GFR, EST NON AFRICAN AMERICAN: 86 mL/min/{1.73_m2} (ref >59)
GLUCOSE: 104 mg/dL — AB (ref 65–99)
Globulin, Total: 3.1 g/dL (ref 1.5–4.5)
Potassium: 4.7 mmol/L (ref 3.5–5.2)
Sodium: 138 mmol/L (ref 134–144)
TOTAL PROTEIN: 7.4 g/dL (ref 6.0–8.5)

## 2017-11-16 LAB — LIPID PANEL
CHOL/HDL RATIO: 2.2 ratio (ref 0.0–5.0)
Cholesterol, Total: 137 mg/dL (ref 100–199)
HDL: 63 mg/dL (ref >39)
LDL Calculated: 62 mg/dL (ref 0–99)
Triglycerides: 62 mg/dL (ref 0–149)
VLDL CHOLESTEROL CAL: 12 mg/dL (ref 5–40)

## 2017-11-18 ENCOUNTER — Encounter: Payer: Self-pay | Admitting: Family Medicine

## 2017-11-18 DIAGNOSIS — Z1211 Encounter for screening for malignant neoplasm of colon: Secondary | ICD-10-CM | POA: Diagnosis not present

## 2017-12-02 LAB — COLOGUARD: COLOGUARD: POSITIVE

## 2017-12-03 ENCOUNTER — Other Ambulatory Visit: Payer: Self-pay

## 2017-12-03 ENCOUNTER — Telehealth: Payer: Self-pay

## 2017-12-03 DIAGNOSIS — R195 Other fecal abnormalities: Secondary | ICD-10-CM

## 2017-12-03 NOTE — Telephone Encounter (Signed)
Called pt to let him know that his cologuard was positive and he should be hearing from [facility] soon to set up an appt. [place]

## 2017-12-06 ENCOUNTER — Encounter: Payer: Self-pay | Admitting: Internal Medicine

## 2017-12-29 ENCOUNTER — Other Ambulatory Visit: Payer: Self-pay

## 2017-12-29 ENCOUNTER — Ambulatory Visit (AMBULATORY_SURGERY_CENTER): Payer: Self-pay

## 2017-12-29 VITALS — Ht 70.0 in | Wt 204.4 lb

## 2017-12-29 DIAGNOSIS — R195 Other fecal abnormalities: Secondary | ICD-10-CM

## 2017-12-29 NOTE — Progress Notes (Signed)
Denies allergies to eggs or soy products. Denies complication of anesthesia or sedation. Denies use of weight loss medication. Denies use of O2.   Emmi instructions declined.  

## 2018-01-13 ENCOUNTER — Encounter: Payer: Self-pay | Admitting: Internal Medicine

## 2018-01-13 ENCOUNTER — Ambulatory Visit (AMBULATORY_SURGERY_CENTER): Payer: Medicare Other | Admitting: Internal Medicine

## 2018-01-13 VITALS — BP 120/84 | HR 79 | Temp 97.8°F | Resp 16 | Ht 70.0 in | Wt 210.0 lb

## 2018-01-13 DIAGNOSIS — R195 Other fecal abnormalities: Secondary | ICD-10-CM

## 2018-01-13 DIAGNOSIS — K635 Polyp of colon: Secondary | ICD-10-CM

## 2018-01-13 DIAGNOSIS — D123 Benign neoplasm of transverse colon: Secondary | ICD-10-CM

## 2018-01-13 DIAGNOSIS — D124 Benign neoplasm of descending colon: Secondary | ICD-10-CM | POA: Diagnosis not present

## 2018-01-13 DIAGNOSIS — D126 Benign neoplasm of colon, unspecified: Secondary | ICD-10-CM

## 2018-01-13 MED ORDER — SODIUM CHLORIDE 0.9 % IV SOLN: 500.0000 mL | Freq: Once | INTRAVENOUS | Status: DC

## 2018-01-13 NOTE — Progress Notes (Signed)
A/ox3, pleased with MAC, report to RN 

## 2018-01-13 NOTE — Progress Notes (Signed)
1325:  While reviewing discharge instructions, advised patient not to drink alcohol today.  Patient stated that he usually has a couple of cocktails in the evening and the last time he went a day without alcohol, he started shaking.  Advised patient of the risks associated with consuming alcohol after having propofol, but he insisted and stated that he would lie in bed while drinking.  Told patient that abstaining was our policy, however we can't control his behavior once he leaves our facility.

## 2018-01-13 NOTE — Progress Notes (Signed)
Pt's states no medical or surgical changes since previsit or office visit. 

## 2018-01-13 NOTE — Op Note (Addendum)
Peter Daniels Patient Name: Peter Daniels Procedure Date: 01/13/2018 12:10 PM MRN: [id number] Endoscopist: Gatha Mayer , MD Age: 72 Referring MD:  Date of Birth: June 20, 1946 Gender: Male Account #: [id number] Procedure:                Colonoscopy Indications:              Positive Cologuard test Medicines:                Propofol per Anesthesia, Monitored Anesthesia Care Procedure:                Pre-Anesthesia Assessment:                           - Prior to the procedure, a History and Physical                            was performed, and patient medications and                            allergies were reviewed. The patient's tolerance of                            previous anesthesia was also reviewed. The risks                            and benefits of the procedure and the sedation                            options and risks were discussed with the patient.                            All questions were answered, and informed consent                            was obtained. Prior Anticoagulants: The patient has                            taken no previous anticoagulant or antiplatelet                            agents. ASA Grade Assessment: II - A patient with                            mild systemic disease. After reviewing the risks                            and benefits, the patient was deemed in                            satisfactory condition to undergo the procedure.                           After obtaining informed consent, the colonoscope  was passed under direct vision. Throughout the                            procedure, the patient's blood pressure, pulse, and                            oxygen saturations were monitored continuously. The                            Colonoscope was introduced through the anus and                            advanced to the the cecum, identified by                            appendiceal orifice  and ileocecal valve. The                            colonoscopy was somewhat difficult due to                            significant looping. Successful completion of the                            procedure was aided by applying abdominal pressure.                            The patient tolerated the procedure well. The                            quality of the bowel preparation was good. The                            bowel preparation used was Miralax. The appendiceal                            orifice and the rectum were photographed. Scope In: 12:21:53 PM Scope Out: 12:48:15 PM Scope Withdrawal Time: 0 hours 18 minutes 55 seconds  Total Procedure Duration: 0 hours 26 minutes 22 seconds  Findings:                 The perianal and digital rectal examinations were                            normal. Pertinent negatives include normal prostate                            (size, shape, and consistency).                           Four flat and sessile polyps were found in the                            descending colon. The polyps were 5 to 8 mm in  size. These polyps were removed with a cold snare.                            Resection and retrieval were complete. Verification                            of patient identification for the specimen was                            done. Estimated blood loss was minimal.                           Two sessile polyps were found in the descending                            colon and transverse colon. The polyps were 2 to 3                            mm in size. These polyps were removed with a cold                            biopsy forceps. Resection and retrieval were                            complete. Verification of patient identification                            for the specimen was done. Estimated blood loss was                            minimal.                           Internal hemorrhoids were found during  retroflexion.                           The exam was otherwise without abnormality on                            direct and retroflexion views. Complications:            No immediate complications. Estimated Blood Loss:     Estimated blood loss was minimal. Impression:               - Four 5 to 8 mm polyps in the descending colon,                            removed with a cold snare. Resected and retrieved.                           - Two 2 to 3 mm polyps in the descending colon and                            in the transverse colon,  removed with a cold biopsy                            forceps. Resected and retrieved.                           - Internal hemorrhoids.                           - The examination was otherwise normal on direct                            and retroflexion views. Recommendation:           - Patient has a contact number available for                            emergencies. The signs and symptoms of potential                            delayed complications were discussed with the                            patient. Return to normal activities tomorrow.                            Written discharge instructions were provided to the                            patient.                           - Resume previous diet.                           - Continue present medications.                           - Repeat colonoscopy is recommended. The                            colonoscopy date will be determined after pathology                            results from today's exam become available for                            review. Gatha Mayer, MD 01/13/2018 12:54:27 PM This report has been signed electronically.

## 2018-01-13 NOTE — Patient Instructions (Addendum)
I found and removed 6 polyps - none look like cancer. I will let you know pathology results and when to have another routine colonoscopy by mail and/or My Chart.  Also saw hemorrhoids.  Any of these could turn the Cologuard positive.  I appreciate the opportunity to care for you. Gatha Mayer, MD, [facility]   HANDOUTS GIVEN FOR POLYPS AND HEMORRHOIDS.  YOU HAD AN ENDOSCOPIC PROCEDURE TODAY AT [facility]:   Refer to the procedure report that was given to you for any specific questions about what was found during the examination.  If the procedure report does not answer your questions, please call your gastroenterologist to clarify.  If you requested that your care partner not be given the details of your procedure findings, then the procedure report has been included in a sealed envelope for you to review at your convenience later.  YOU SHOULD EXPECT: Some feelings of bloating in the abdomen. Passage of more gas than usual.  Walking can help get rid of the air that was put into your GI tract during the procedure and reduce the bloating. If you had a lower endoscopy (such as a colonoscopy or flexible sigmoidoscopy) you may notice spotting of blood in your stool or on the toilet paper. If you underwent a bowel prep for your procedure, you may not have a normal bowel movement for a few days.  Please Note:  You might notice some irritation and congestion in your nose or some drainage.  This is from the oxygen used during your procedure.  There is no need for concern and it should clear up in a day or so.  SYMPTOMS TO REPORT IMMEDIATELY:   Following lower endoscopy (colonoscopy or flexible sigmoidoscopy):  Excessive amounts of blood in the stool  Significant tenderness or worsening of abdominal pains  Swelling of the abdomen that is new, acute  Fever of 100F or higher   For urgent or emergent issues, a gastroenterologist can be reached at any hour by calling [phone]  [phone].   DIET:  We do recommend a small meal at first, but then you may proceed to your regular diet.  Drink plenty of fluids but you should avoid alcoholic beverages for 24 hours.  ACTIVITY:  You should plan to take it easy for the rest of today and you should NOT DRIVE or use heavy machinery until tomorrow (because of the sedation medicines used during the test).    FOLLOW UP: Our staff will call the number listed on your records the next business day following your procedure to check on you and address any questions or concerns that you may have regarding the information given to you following your procedure. If we do not reach you, we will leave a message.  However, if you are feeling well and you are not experiencing any problems, there is no need to return our call.  We will assume that you have returned to your regular daily activities without incident.  If any biopsies were taken you will be contacted by phone or by letter within the next 1-3 weeks.  Please call us at [phone] if you have not heard about the biopsies in 3 weeks.    SIGNATURES/CONFIDENTIALITY: You and/or your care partner have signed paperwork which will be entered into your electronic medical record.  These signatures attest to the fact that that the information above on your After Visit Summary has been reviewed and is understood.  Full responsibility of the  confidentiality of this discharge information lies with you and/or your care-partner. 

## 2018-01-13 NOTE — Progress Notes (Signed)
Called to room to assist during endoscopic procedure.  Patient ID and intended procedure confirmed with present staff. Received instructions for my participation in the procedure from the performing physician.  

## 2018-01-14 ENCOUNTER — Telehealth: Payer: Self-pay

## 2018-01-14 NOTE — Telephone Encounter (Signed)
Left message

## 2018-01-14 NOTE — Telephone Encounter (Signed)
No answer, left message, B.Sharine Cadle RN

## 2018-01-19 ENCOUNTER — Telehealth: Payer: Self-pay | Admitting: Internal Medicine

## 2018-01-19 ENCOUNTER — Encounter: Payer: Self-pay | Admitting: Internal Medicine

## 2018-01-19 DIAGNOSIS — Z860100 Personal history of colon polyps, unspecified: Secondary | ICD-10-CM | POA: Insufficient documentation

## 2018-01-19 HISTORY — DX: Personal history of colon polyps, unspecified: Z86.0100

## 2018-01-19 HISTORY — DX: Personal history of colonic polyps: Z86.010

## 2018-01-19 NOTE — Telephone Encounter (Signed)
Patient is requesting pathology results from 12/28/17.  I do not see letter

## 2018-01-20 NOTE — Telephone Encounter (Signed)
It was from 7/11  My Chart letter went out last night   ? Call him or see if he read it

## 2018-01-20 NOTE — Telephone Encounter (Signed)
Reviewed the letter with the patient.  All questions answered

## 2018-02-15 ENCOUNTER — Encounter: Payer: Medicare Other | Admitting: Internal Medicine

## 2018-02-22 ENCOUNTER — Encounter: Payer: Medicare Other | Admitting: Internal Medicine

## 2018-07-28 DIAGNOSIS — E119 Type 2 diabetes mellitus without complications: Secondary | ICD-10-CM | POA: Diagnosis not present

## 2018-07-28 DIAGNOSIS — H25813 Combined forms of age-related cataract, bilateral: Secondary | ICD-10-CM | POA: Diagnosis not present

## 2018-07-28 DIAGNOSIS — H43813 Vitreous degeneration, bilateral: Secondary | ICD-10-CM | POA: Diagnosis not present

## 2018-07-28 DIAGNOSIS — H5213 Myopia, bilateral: Secondary | ICD-10-CM | POA: Diagnosis not present

## 2018-07-28 LAB — HM DIABETES EYE EXAM

## 2018-11-08 ENCOUNTER — Telehealth: Payer: Self-pay | Admitting: Family Medicine

## 2018-11-08 ENCOUNTER — Other Ambulatory Visit: Payer: Self-pay

## 2018-11-08 DIAGNOSIS — I10 Essential (primary) hypertension: Principal | ICD-10-CM

## 2018-11-08 DIAGNOSIS — E1159 Type 2 diabetes mellitus with other circulatory complications: Secondary | ICD-10-CM

## 2018-11-08 MED ORDER — LISINOPRIL 5 MG PO TABS: 5.0000 mg | ORAL_TABLET | Freq: Every day | ORAL | 3 refills | Status: DC

## 2018-11-08 NOTE — Telephone Encounter (Signed)
Pharmacy called and is requesting a refill for pt lipitor pt needs its to go to [facility] order

## 2018-11-08 NOTE — Telephone Encounter (Signed)
Called pt to advise med was sent into [facility]. [facility]

## 2018-11-08 NOTE — Telephone Encounter (Signed)
Done [facility] 

## 2018-11-09 ENCOUNTER — Other Ambulatory Visit: Payer: Self-pay

## 2018-11-09 ENCOUNTER — Telehealth: Payer: Self-pay | Admitting: Family Medicine

## 2018-11-09 DIAGNOSIS — E785 Hyperlipidemia, unspecified: Principal | ICD-10-CM

## 2018-11-09 DIAGNOSIS — I10 Essential (primary) hypertension: Principal | ICD-10-CM

## 2018-11-09 DIAGNOSIS — E1159 Type 2 diabetes mellitus with other circulatory complications: Secondary | ICD-10-CM

## 2018-11-09 DIAGNOSIS — E1169 Type 2 diabetes mellitus with other specified complication: Secondary | ICD-10-CM

## 2018-11-09 MED ORDER — ATORVASTATIN CALCIUM 20 MG PO TABS: 20.0000 mg | ORAL_TABLET | Freq: Every day | ORAL | 3 refills | Status: DC

## 2018-11-09 MED ORDER — LISINOPRIL 5 MG PO TABS: 5.0000 mg | ORAL_TABLET | Freq: Every day | ORAL | 3 refills | Status: DC

## 2018-11-09 NOTE — Telephone Encounter (Signed)
Pt called and he also needs a refill on lipitor,

## 2018-11-09 NOTE — Telephone Encounter (Signed)
Med sent over to [facility] per pt request. [facility]

## 2018-11-17 ENCOUNTER — Other Ambulatory Visit: Payer: Self-pay

## 2018-11-17 ENCOUNTER — Ambulatory Visit (INDEPENDENT_AMBULATORY_CARE_PROVIDER_SITE_OTHER): Payer: Medicare Other | Admitting: Family Medicine

## 2018-11-17 ENCOUNTER — Encounter: Payer: Self-pay | Admitting: Family Medicine

## 2018-11-17 VITALS — BP 136/88 | HR 82 | Temp 98.7°F | Ht 70.0 in | Wt 209.0 lb

## 2018-11-17 DIAGNOSIS — I152 Hypertension secondary to endocrine disorders: Secondary | ICD-10-CM

## 2018-11-17 DIAGNOSIS — Z1159 Encounter for screening for other viral diseases: Secondary | ICD-10-CM

## 2018-11-17 DIAGNOSIS — E785 Hyperlipidemia, unspecified: Secondary | ICD-10-CM | POA: Diagnosis not present

## 2018-11-17 DIAGNOSIS — Z86010 Personal history of colonic polyps: Secondary | ICD-10-CM | POA: Diagnosis not present

## 2018-11-17 DIAGNOSIS — I700 Atherosclerosis of aorta: Secondary | ICD-10-CM | POA: Diagnosis not present

## 2018-11-17 DIAGNOSIS — E1169 Type 2 diabetes mellitus with other specified complication: Secondary | ICD-10-CM

## 2018-11-17 DIAGNOSIS — E1159 Type 2 diabetes mellitus with other circulatory complications: Secondary | ICD-10-CM | POA: Diagnosis not present

## 2018-11-17 DIAGNOSIS — E118 Type 2 diabetes mellitus with unspecified complications: Secondary | ICD-10-CM

## 2018-11-17 DIAGNOSIS — I10 Essential (primary) hypertension: Secondary | ICD-10-CM | POA: Diagnosis not present

## 2018-11-17 DIAGNOSIS — D126 Benign neoplasm of colon, unspecified: Secondary | ICD-10-CM

## 2018-11-17 LAB — POCT UA - MICROALBUMIN
Albumin/Creatinine Ratio, Urine, POC: 10.3
Creatinine, POC: 167.5 mg/dL
Microalbumin Ur, POC: 17.3 mg/L

## 2018-11-17 LAB — POCT GLYCOSYLATED HEMOGLOBIN (HGB A1C): Hemoglobin A1C: 6.4 % — AB (ref 4.0–5.6)

## 2018-11-17 MED ORDER — ATORVASTATIN CALCIUM 20 MG PO TABS: 20.0000 mg | ORAL_TABLET | Freq: Every day | ORAL | 3 refills | Status: DC

## 2018-11-17 MED ORDER — LISINOPRIL 5 MG PO TABS: 5.0000 mg | ORAL_TABLET | Freq: Every day | ORAL | 3 refills | Status: DC

## 2018-11-17 NOTE — Patient Instructions (Addendum)
  Peter Daniels , Thank you for taking time to come for your Medicare Wellness Visit. I appreciate your ongoing commitment to your health goals. Please review the following plan we discussed and let me know if I can assist you in the future.   These are the goals we discussed: Continue on your present medications but try to cut down on alcohol to 1 or 2 drinks per day.  Recommend 20 minutes of something physical daily or 150 minutes a week of something physical.  Check your feet regularly  this is a list of the screening recommended for you and due dates:  Health Maintenance  Topic Date Due  .  Hepatitis C: One time screening is recommended by Center for Disease Control  (CDC) for  adults born from [birth year] through 1967.   [birth year]  . Complete foot exam   01/22/2017  . Hemoglobin A1C  05/18/2018  . Flu Shot  02/04/2019  . Eye exam for diabetics  07/29/2019  . Tetanus Vaccine  09/06/2019  . Colon Cancer Screening  01/13/2021  . Pneumonia vaccines  Completed

## 2018-11-17 NOTE — Progress Notes (Signed)
Peter Daniels is a 73 y.o. male who presents for annual wellness visit and follow-up on chronic medical conditions.  He has no particular concerns or complaints.  He continues on atorvastatin and lisinopril and is having no difficulty with that.  He is a former smoker and has had an ultrasound.  The ultrasound did show evidence of atherosclerosis.  He drinks 4-5 beverages per day and is not interested in stopping.  Does periodically check his blood sugar roughly every 2 weeks and these numbers avoids been good.  He has had an eye exam.  He does not check his feet regularly.   Immunizations and Health Maintenance Immunization History  Administered Date(s) Administered  . Influenza Split 07/09/2011  . Influenza, High Dose Seasonal PF 05/01/2014, 05/13/2015  . Pneumococcal Conjugate-13 05/01/2014  . Pneumococcal Polysaccharide-23 03/10/2013  . Tdap 09/05/2009   Health Maintenance Due  Topic Date Due  . Hepatitis C Screening  1945/10/31  . FOOT EXAM  01/22/2017  . HEMOGLOBIN A1C  05/18/2018    Last colonoscopy:01-13-18 Last PSA: Unkown Dentist: 8 years ago Ophtho: six months ago Exercise: standing three hours a day/ walking   Other doctors caring for patient include: Dr. Carlean [facility],   Advanced Directives:No; info given Does Patient Have a Medical Advance Directive?: No Would patient like information on creating a medical advance directive?: Yes (MAU/Ambulatory/Procedural Areas - Information given)  Depression screen:  See questionnaire below.     Depression screen [facility] 2/9 11/17/2018 11/15/2017 01/23/2016 01/23/2016 05/13/2015  Decreased Interest 0 0 0 0 0  Down, Depressed, Hopeless 0 1 0 0 0  PHQ - 2 Score 0 1 0 0 0    Fall Screen: See Questionaire below.   Fall Risk  11/17/2018 11/15/2017 06/10/2017 01/23/2016 01/23/2016  Falls in the past year? 0 No No No No  Comment - - Emmi Telephone Survey: data to providers prior to load - -    ADL screen:  See questionnaire below.   Functional Status Survey: Is the patient deaf or have difficulty hearing?: No Does the patient have difficulty seeing, even when wearing glasses/contacts?: No Does the patient have difficulty concentrating, remembering, or making decisions?: Yes Does the patient have difficulty walking or climbing stairs?: No Does the patient have difficulty dressing or bathing?: No Does the patient have difficulty doing errands alone such as visiting a doctor's office or shopping?: No   Review of Systems  Constitutional: -, -unexpected weight change, -anorexia, -fatigue Allergy: -sneezing, -itching, -congestion Dermatology: denies changing moles, rash, lumps ENT: -runny nose, -ear pain, -sore throat,  Cardiology:  -chest pain, -palpitations, -orthopnea, Respiratory: -cough, -shortness of breath, -dyspnea on exertion, -wheezing,  Gastroenterology: -abdominal pain, -nausea, -vomiting, -diarrhea, -constipation, -dysphagia Hematology: -bleeding or bruising problems Musculoskeletal: -arthralgias, -myalgias, -joint swelling, -back pain, - Ophthalmology: -vision changes,  Urology: -dysuria, -difficulty urinating,  -urinary frequency, -urgency, incontinence Neurology: -, -numbness, , -memory loss, -falls, -dizziness    PHYSICAL EXAM:  BP 136/88 (BP Location: Left Arm, Patient Position: Sitting)   Pulse 82   Temp 98.7 F (37.1 C)   Ht 5\' 10"  (1.778 m)   Wt 209 lb (94.8 kg)   SpO2 99%   BMI 29.99 kg/m   General Appearance: Alert, cooperative, no distress, appears stated age Head: Normocephalic, without obvious abnormality, atraumatic Eyes: PERRL, conjunctiva/corneas clear, EOM's intact, fundi benign Ears: Normal TM's and external ear canals Nose: Nares normal, mucosa normal, no drainage or sinus   tenderness Throat: Lips, mucosa, and tongue normal; teeth and gums  normal Neck: Supple, no lymphadenopathy, thyroid:no enlargement/tenderness/nodules; no carotid bruit or JVD Lungs: Clear to  auscultation bilaterally without wheezes, rales or ronchi; respirations unlabored Heart: Regular rate and rhythm, S1 and S2 normal, no murmur, rub or gallop Abdomen: Soft, non-tender, nondistended, normoactive bowel sounds, no masses, no hepatosplenomegaly Extremities: No clubbing, cyanosis or edema Pulses: 2+ and symmetric all extremities Skin: Skin color, texture, turgor normal, no rashes or lesions Lymph nodes: Cervical, supraclavicular, and axillary nodes normal Neurologic: CNII-XII intact, normal strength, sensation and gait; reflexes 2+ and symmetric throughout   Psych: Normal mood, affect, hygiene and grooming Hemoglobin A1c is 6.4 ASSESSMENT/PLAN: Adenomatous polyp of colon, unspecified part of colon  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HCC) - Plan: CBC with Differential/Platelet, Comprehensive metabolic panel, POCT UA - Microalbumin  Hyperlipidemia associated with type 2 diabetes mellitus (HCC) - Plan: Lipid panel, atorvastatin (LIPITOR) 20 MG tablet  Hypertension associated with diabetes ([place]) - Plan: CBC with Differential/Platelet, Comprehensive metabolic panel, lisinopril (ZESTRIL) 5 MG tablet  Hx of colonic polyps  Encounter for hepatitis C screening test for low risk patient - Plan: Hepatitis C antibody Cautioned to cut down on alcohol consumption.  Colonoscopy recommendations reviewed. Increase his physical activity to 20 minutes of something physical daily or 150 minutes a week of something physical.  Medicare Attestation I have personally reviewed: The patient's medical and social history Their use of alcohol, tobacco or illicit drugs Their current medications and supplements The patient's functional ability including ADLs,fall risks, home safety risks, cognitive, and hearing and visual impairment Diet and physical activities Evidence for depression or mood disorders  The patient's weight, height, and BMI have been recorded in the chart.  I have made  referrals, counseling, and provided education to the patient based on review of the above and I have provided the patient with a written personalized care plan for preventive services.     Jill Alexanders, MD   11/17/2018

## 2018-11-18 LAB — COMPREHENSIVE METABOLIC PANEL
ALT: 25 IU/L (ref 0–44)
AST: 23 IU/L (ref 0–40)
Albumin/Globulin Ratio: 1.6 (ref 1.2–2.2)
Albumin: 4.7 g/dL (ref 3.7–4.7)
Alkaline Phosphatase: 59 IU/L (ref 39–117)
BUN/Creatinine Ratio: 12 (ref 10–24)
BUN: 11 mg/dL (ref 8–27)
Bilirubin Total: 2.1 mg/dL — ABNORMAL HIGH (ref 0.0–1.2)
CO2: 22 mmol/L (ref 20–29)
Calcium: 9.7 mg/dL (ref 8.6–10.2)
Chloride: 100 mmol/L (ref 96–106)
Creatinine, Ser: 0.93 mg/dL (ref 0.76–1.27)
GFR calc Af Amer: 93 mL/min/{1.73_m2} (ref >59)
GFR calc non Af Amer: 81 mL/min/{1.73_m2} (ref >59)
Globulin, Total: 3 g/dL (ref 1.5–4.5)
Glucose: 121 mg/dL — ABNORMAL HIGH (ref 65–99)
Potassium: 4.8 mmol/L (ref 3.5–5.2)
Sodium: 138 mmol/L (ref 134–144)
Total Protein: 7.7 g/dL (ref 6.0–8.5)

## 2018-11-18 LAB — CBC WITH DIFFERENTIAL/PLATELET
Basophils Absolute: 0 10*3/uL (ref 0.0–0.2)
Basos: 0 %
EOS (ABSOLUTE): 0.2 10*3/uL (ref 0.0–0.4)
Eos: 2 %
Hematocrit: 44.8 % (ref 37.5–51.0)
Hemoglobin: 15.5 g/dL (ref 13.0–17.7)
Immature Grans (Abs): 0 10*3/uL (ref 0.0–0.1)
Immature Granulocytes: 0 %
Lymphocytes Absolute: 1.6 10*3/uL (ref 0.7–3.1)
Lymphs: 21 %
MCH: 31.1 pg (ref 26.6–33.0)
MCHC: 34.6 g/dL (ref 31.5–35.7)
MCV: 90 fL (ref 79–97)
Monocytes Absolute: 0.5 10*3/uL (ref 0.1–0.9)
Monocytes: 6 %
Neutrophils Absolute: 5.3 10*3/uL (ref 1.4–7.0)
Neutrophils: 71 %
Platelets: 212 10*3/uL (ref 150–450)
RBC: 4.99 x10E6/uL (ref 4.14–5.80)
RDW: 12.5 % (ref 11.6–15.4)
WBC: 7.6 10*3/uL (ref 3.4–10.8)

## 2018-11-18 LAB — LIPID PANEL
Chol/HDL Ratio: 2.7 ratio (ref 0.0–5.0)
Cholesterol, Total: 169 mg/dL (ref 100–199)
HDL: 63 mg/dL (ref >39)
LDL Calculated: 93 mg/dL (ref 0–99)
Triglycerides: 64 mg/dL (ref 0–149)
VLDL Cholesterol Cal: 13 mg/dL (ref 5–40)

## 2018-11-18 LAB — HEPATITIS C ANTIBODY: Hep C Virus Ab: <0.1 s/co ratio (ref 0.0–0.9)

## 2018-11-22 ENCOUNTER — Encounter: Payer: Self-pay | Admitting: Family Medicine

## 2019-03-09 DIAGNOSIS — H524 Presbyopia: Secondary | ICD-10-CM | POA: Diagnosis not present

## 2019-03-09 DIAGNOSIS — H43813 Vitreous degeneration, bilateral: Secondary | ICD-10-CM | POA: Diagnosis not present

## 2019-03-09 DIAGNOSIS — H25813 Combined forms of age-related cataract, bilateral: Secondary | ICD-10-CM | POA: Diagnosis not present

## 2019-03-09 DIAGNOSIS — E119 Type 2 diabetes mellitus without complications: Secondary | ICD-10-CM | POA: Diagnosis not present

## 2019-03-09 LAB — HM DIABETES EYE EXAM

## 2019-03-28 DIAGNOSIS — Z23 Encounter for immunization: Secondary | ICD-10-CM | POA: Diagnosis not present

## 2019-09-21 ENCOUNTER — Other Ambulatory Visit: Payer: Self-pay

## 2019-09-21 ENCOUNTER — Encounter: Payer: Self-pay | Admitting: Family Medicine

## 2019-09-21 DIAGNOSIS — E1159 Type 2 diabetes mellitus with other circulatory complications: Secondary | ICD-10-CM

## 2019-09-21 MED ORDER — LISINOPRIL 5 MG PO TABS: 5.0000 mg | ORAL_TABLET | Freq: Every day | ORAL | 1 refills | Status: DC

## 2019-11-17 ENCOUNTER — Ambulatory Visit (INDEPENDENT_AMBULATORY_CARE_PROVIDER_SITE_OTHER): Payer: Medicare Other | Admitting: Family Medicine

## 2019-11-17 ENCOUNTER — Other Ambulatory Visit: Payer: Self-pay

## 2019-11-17 VITALS — BP 140/82 | HR 80 | Temp 97.5°F | Ht 69.25 in | Wt 210.4 lb

## 2019-11-17 DIAGNOSIS — Z86010 Personal history of colonic polyps: Secondary | ICD-10-CM | POA: Diagnosis not present

## 2019-11-17 DIAGNOSIS — E785 Hyperlipidemia, unspecified: Secondary | ICD-10-CM | POA: Diagnosis not present

## 2019-11-17 DIAGNOSIS — D126 Benign neoplasm of colon, unspecified: Secondary | ICD-10-CM

## 2019-11-17 DIAGNOSIS — Z0000 Encounter for general adult medical examination without abnormal findings: Secondary | ICD-10-CM

## 2019-11-17 DIAGNOSIS — I10 Essential (primary) hypertension: Secondary | ICD-10-CM

## 2019-11-17 DIAGNOSIS — E118 Type 2 diabetes mellitus with unspecified complications: Secondary | ICD-10-CM

## 2019-11-17 DIAGNOSIS — E1159 Type 2 diabetes mellitus with other circulatory complications: Secondary | ICD-10-CM

## 2019-11-17 DIAGNOSIS — E1169 Type 2 diabetes mellitus with other specified complication: Secondary | ICD-10-CM

## 2019-11-17 DIAGNOSIS — I700 Atherosclerosis of aorta: Secondary | ICD-10-CM | POA: Diagnosis not present

## 2019-11-17 LAB — POCT UA - MICROALBUMIN
Albumin/Creatinine Ratio, Urine, POC: 13.5
Creatinine, POC: 124.9 mg/dL
Microalbumin Ur, POC: 16.9 mg/L

## 2019-11-17 LAB — POCT GLYCOSYLATED HEMOGLOBIN (HGB A1C): Hemoglobin A1C: 6.2 % — AB (ref 4.0–5.6)

## 2019-11-17 MED ORDER — ATORVASTATIN CALCIUM 20 MG PO TABS: 20.0000 mg | ORAL_TABLET | Freq: Every day | ORAL | 3 refills | Status: DC

## 2019-11-17 MED ORDER — LISINOPRIL 5 MG PO TABS: 5.0000 mg | ORAL_TABLET | Freq: Every day | ORAL | 3 refills | Status: DC

## 2019-11-17 NOTE — Progress Notes (Signed)
Peter Daniels is a 74 y.o. male who presents for annual wellness visit,CPE and follow-up on chronic medical conditions.  He has no particular concerns or complaints.  He continues on lisinopril and is having no difficulty with that.  He is also taking atorvastatin without aches or pains.  Presently he is on no diabetes related medications.  His previous hemoglobin A1c was 6.4.  He has been able to keep his weight down but still does drink 3 or 4 beverages per night and has done that for several decades.  He is not interested in quitting.  He does have a history of colonic polyps and is scheduled for routine follow-up.  There is also history of atherosclerosis.  He is retired.  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   Immunizations and Health Maintenance Immunization History  Administered Date(s) Administered  . Influenza Split 07/09/2011  . Influenza, High Dose Seasonal PF 05/01/2014, 05/13/2015  . Pneumococcal Conjugate-13 05/01/2014  . Pneumococcal Polysaccharide-23 03/10/2013  . Tdap 09/05/2009   Health Maintenance Due  Topic Date Due  . COVID-19 Vaccine (1) Never done  . TETANUS/TDAP  09/06/2019  . FOOT EXAM  11/17/2019    Last colonoscopy: 01/13/18 Last PSA: Unknown  Dentist: Never  Ophtho: 04/05/19 Exercise: stay active  Other doctors caring for patient include: Dr. Bethann Goo, Dr. Carlean Purl [facility],   Advanced Directives: Not on File info given Does Patient Have a Medical Advance Directive?: No Would patient like information on creating a medical advance directive?: Yes (MAU/Ambulatory/Procedural Areas - Information given)  Depression screen:  See questionnaire below.     Depression screen [facility] 2/9 11/17/2019 11/17/2018 11/15/2017 01/23/2016 01/23/2016  Decreased Interest 0 0 0 0 0  Down, Depressed, Hopeless 0 0 1 0 0  PHQ - 2 Score 0 0 1 0 0    Fall Screen: See Questionaire below.   Fall Risk  11/17/2019  11/17/2018 11/15/2017 06/10/2017 01/23/2016  Falls in the past year? 0 0 No No No  Comment - - - Emmi Telephone Survey: data to providers prior to load -    ADL screen:  See questionnaire below.  Functional Status Survey: Is the patient deaf or have difficulty hearing?: Yes Does the patient have difficulty seeing, even when wearing glasses/contacts?: No Does the patient have difficulty concentrating, remembering, or making decisions?: No Does the patient have difficulty walking or climbing stairs?: No Does the patient have difficulty dressing or bathing?: No Does the patient have difficulty doing errands alone such as visiting a doctor's office or shopping?: No   Review of Systems  Constitutional: -, -unexpected weight change, -anorexia, -fatigue Allergy: -sneezing, -itching, -congestion Dermatology: denies changing moles, rash, lumps ENT: -runny nose, -ear pain, -sore throat,  Cardiology:  -chest pain, -palpitations, -orthopnea, Respiratory: -cough, -shortness of breath, -dyspnea on exertion, -wheezing,  Gastroenterology: -abdominal pain, -nausea, -vomiting, -diarrhea, -constipation, -dysphagia Hematology: -bleeding or bruising problems Musculoskeletal: -arthralgias, -myalgias, -joint swelling, -back pain, - Ophthalmology: -vision changes,  Urology: -dysuria, -difficulty urinating,  -urinary frequency, -urgency, incontinence Neurology: -, -numbness, , -memory loss, -falls, -dizziness    PHYSICAL EXAM:   General Appearance: Alert, cooperative, no distress, appears stated age Head: Normocephalic, without obvious abnormality, atraumatic Eyes: PERRL, conjunctiva/corneas clear, EOM's intact,  Ears: Normal TM's and external ear canals Nose: Nares normal, mucosa normal, no drainage or sinus   tenderness Throat: Lips, mucosa, and tongue normal; teeth and gums normal Neck: Supple,  no lymphadenopathy, thyroid:no enlargement/tenderness/nodules; no carotid bruit or JVD Lungs: Clear to  auscultation bilaterally without wheezes, rales or ronchi; respirations unlabored Heart: Regular rate and rhythm, S1 and S2 normal, no murmur, rub or gallop Abdomen: Soft, non-tender, nondistended, normoactive bowel sounds, no masses, no hepatosplenomegaly Extremities: No clubbing, cyanosis or edema Pulses: 2+ and symmetric all extremities Skin: Skin color, texture, turgor normal, no rashes or lesions Lymph nodes: Cervical, supraclavicular, and axillary nodes normal Neurologic: CNII-XII intact, normal strength, sensation and gait; reflexes 2+ and symmetric throughout   Psych: Normal mood, affect, hygiene and grooming Hemoglobin A1c is 6.2 ASSESSMENT/PLAN: Routine general medical examination at a health care facility  Adenomatous polyp of colon, unspecified part of colon  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[place]) - Plan: POCT UA - Microalbumin, POCT glycosylated hemoglobin (Hb A1C), CBC with Differential/Platelet, Comprehensive metabolic panel, Lipid panel  Hyperlipidemia associated with type 2 diabetes mellitus (HCC) - Plan: Lipid panel, atorvastatin (LIPITOR) 20 MG tablet  Atherosclerosis of aorta (HCC)  Hypertension associated with diabetes ([place]) - Plan: CBC with Differential/Platelet, Comprehensive metabolic panel, lisinopril (ZESTRIL) 5 MG tablet  Hx of colonic polyps I encouraged him to get the Shingrix and Tdap shots.  He will follow-up on 2022 with colonoscopy.  Discussed cutting back on alcohol.  Also recommend physical activity of at least 20 minutes daily.   healthy diet and alcohol recommendations (less than or equal to 2 drinks/day) reviewed;Immunization recommendations discussed.  Colonoscopy recommendations reviewed.   Medicare Attestation I have personally reviewed: The patient's medical and social history Their use of alcohol, tobacco or illicit drugs Their current medications and supplements The patient's functional ability including ADLs,fall risks, home  safety risks, cognitive, and hearing and visual impairment Diet and physical activities Evidence for depression or mood disorders  The patient's weight, height, and BMI have been recorded in the chart.  I have made referrals, counseling, and provided education to the patient based on review of the above and I have provided the patient with a written personalized care plan for preventive services.     Jill Alexanders, MD   11/17/2019

## 2019-11-18 LAB — CBC WITH DIFFERENTIAL/PLATELET
Basophils Absolute: 0.1 10*3/uL (ref 0.0–0.2)
Basos: 1 %
EOS (ABSOLUTE): 0.2 10*3/uL (ref 0.0–0.4)
Eos: 3 %
Hematocrit: 43.2 % (ref 37.5–51.0)
Hemoglobin: 14.8 g/dL (ref 13.0–17.7)
Immature Grans (Abs): 0 10*3/uL (ref 0.0–0.1)
Immature Granulocytes: 0 %
Lymphocytes Absolute: 1.9 10*3/uL (ref 0.7–3.1)
Lymphs: 28 %
MCH: 30.8 pg (ref 26.6–33.0)
MCHC: 34.3 g/dL (ref 31.5–35.7)
MCV: 90 fL (ref 79–97)
Monocytes Absolute: 0.5 10*3/uL (ref 0.1–0.9)
Monocytes: 7 %
Neutrophils Absolute: 4.3 10*3/uL (ref 1.4–7.0)
Neutrophils: 61 %
Platelets: 213 10*3/uL (ref 150–450)
RBC: 4.81 x10E6/uL (ref 4.14–5.80)
RDW: 12.3 % (ref 11.6–15.4)
WBC: 6.9 10*3/uL (ref 3.4–10.8)

## 2019-11-18 LAB — COMPREHENSIVE METABOLIC PANEL
ALT: 23 IU/L (ref 0–44)
AST: 26 IU/L (ref 0–40)
Albumin/Globulin Ratio: 1.7 (ref 1.2–2.2)
Albumin: 4.7 g/dL (ref 3.7–4.7)
Alkaline Phosphatase: 59 IU/L (ref 39–117)
BUN/Creatinine Ratio: 15 (ref 10–24)
BUN: 14 mg/dL (ref 8–27)
Bilirubin Total: 1.6 mg/dL — ABNORMAL HIGH (ref 0.0–1.2)
CO2: 25 mmol/L (ref 20–29)
Calcium: 10.2 mg/dL (ref 8.6–10.2)
Chloride: 99 mmol/L (ref 96–106)
Creatinine, Ser: 0.92 mg/dL (ref 0.76–1.27)
GFR calc Af Amer: 94 mL/min/{1.73_m2} (ref >59)
GFR calc non Af Amer: 81 mL/min/{1.73_m2} (ref >59)
Globulin, Total: 2.8 g/dL (ref 1.5–4.5)
Glucose: 109 mg/dL — ABNORMAL HIGH (ref 65–99)
Potassium: 5 mmol/L (ref 3.5–5.2)
Sodium: 138 mmol/L (ref 134–144)
Total Protein: 7.5 g/dL (ref 6.0–8.5)

## 2019-11-18 LAB — LIPID PANEL
Chol/HDL Ratio: 2.6 ratio (ref 0.0–5.0)
Cholesterol, Total: 162 mg/dL (ref 100–199)
HDL: 62 mg/dL (ref >39)
LDL Chol Calc (NIH): 87 mg/dL (ref 0–99)
Triglycerides: 68 mg/dL (ref 0–149)
VLDL Cholesterol Cal: 13 mg/dL (ref 5–40)

## 2020-01-16 ENCOUNTER — Other Ambulatory Visit: Payer: Self-pay | Admitting: Family Medicine

## 2020-01-16 DIAGNOSIS — E1159 Type 2 diabetes mellitus with other circulatory complications: Secondary | ICD-10-CM

## 2020-01-16 DIAGNOSIS — E785 Hyperlipidemia, unspecified: Secondary | ICD-10-CM

## 2020-01-28 DIAGNOSIS — L03116 Cellulitis of left lower limb: Secondary | ICD-10-CM | POA: Diagnosis not present

## 2020-01-28 DIAGNOSIS — S90862A Insect bite (nonvenomous), left foot, initial encounter: Secondary | ICD-10-CM | POA: Diagnosis not present

## 2020-01-30 ENCOUNTER — Encounter: Payer: Self-pay | Admitting: Family Medicine

## 2020-01-31 ENCOUNTER — Other Ambulatory Visit: Payer: Self-pay

## 2020-01-31 ENCOUNTER — Ambulatory Visit (INDEPENDENT_AMBULATORY_CARE_PROVIDER_SITE_OTHER): Payer: Medicare Other | Admitting: Family Medicine

## 2020-01-31 ENCOUNTER — Encounter: Payer: Self-pay | Admitting: Family Medicine

## 2020-01-31 VITALS — BP 120/74 | HR 94 | Temp 99.0°F | Wt 212.8 lb

## 2020-01-31 DIAGNOSIS — K529 Noninfective gastroenteritis and colitis, unspecified: Secondary | ICD-10-CM

## 2020-01-31 DIAGNOSIS — L03116 Cellulitis of left lower limb: Secondary | ICD-10-CM

## 2020-01-31 MED ORDER — ONDANSETRON 4 MG PO TBDP: 4.0000 mg | ORAL_TABLET | Freq: Three times a day (TID) | ORAL | 0 refills | Status: DC | PRN

## 2020-01-31 NOTE — Patient Instructions (Signed)
Go to the drugstore and get the tetanus shot and talk to them about Shingrix .  2 Tylenol 4 times per day for the aches and pains.  I will call in some medicine for the nausea.  You can use Imodium for the diarrhea.  Stay on the antibiotic set up an appointment to see me in a week

## 2020-01-31 NOTE — Progress Notes (Signed)
   Subjective:    Patient ID: Peter Daniels, male    DOB: June 21, 1946, 74 y.o.   MRN: [id number]  HPI He has had difficulty for approximately the last 8 days with some swelling and discomfort to the fourth and fifth toes on the left.  This got worse and he was seen on Sunday in an urgent care center and treated for cellulitis with Septra.  He states that right now he is feeling 80% better.  Over the last 2 days he has had 2 episodes of vomiting as well as feeling weak and some diarrhea but no fever or chills.  Some abdominal bloating type symptoms.   Review of Systems     Objective:   Physical Exam Alert and in no distress. Tympanic membranes and canals are normal. Pharyngeal area is normal. Neck is supple without adenopathy or thyromegaly. Cardiac exam shows a regular sinus rhythm without murmurs or gallops. Lungs are clear to auscultation.  Abdominal exam shows decreased bowel sounds without masses or tenderness.  Exam of the left fourth and fifth toes does show some healing lesions but no erythema or drainage.        Assessment & Plan:  Cellulitis of left lower extremity  Gastroenteritis - Plan: ondansetron (ZOFRAN ODT) 4 MG disintegrating tablet I explained that I did not think that the antibiotic was causing his GI symptoms.  I think that is probably viral GI symptoms. Go to the drugstore and get the tetanus shot and talk to them about Shingrix .  2 Tylenol 4 times per day for the aches and pains.  I will call in some medicine for the nausea.  You can use Imodium for the diarrhea.  Stay on the antibiotic set up an appointment to see me in a week

## 2020-02-06 ENCOUNTER — Ambulatory Visit (INDEPENDENT_AMBULATORY_CARE_PROVIDER_SITE_OTHER): Payer: Medicare Other | Admitting: Family Medicine

## 2020-02-06 ENCOUNTER — Other Ambulatory Visit: Payer: Self-pay

## 2020-02-06 ENCOUNTER — Telehealth: Payer: Self-pay | Admitting: Internal Medicine

## 2020-02-06 VITALS — BP 142/78 | HR 92 | Temp 98.7°F | Wt 210.2 lb

## 2020-02-06 DIAGNOSIS — L03116 Cellulitis of left lower limb: Secondary | ICD-10-CM

## 2020-02-06 DIAGNOSIS — T7840XA Allergy, unspecified, initial encounter: Secondary | ICD-10-CM | POA: Diagnosis not present

## 2020-02-06 MED ORDER — METHYLPREDNISOLONE SODIUM SUCC 125 MG IJ SOLR
125.0000 mg | Freq: Once | INTRAMUSCULAR | Status: AC
  Administered 2020-02-06: 125 mg via INTRAMUSCULAR

## 2020-02-06 MED ORDER — PREDNISONE 10 MG (21) PO TBPK: ORAL_TABLET | ORAL | 0 refills | Status: DC

## 2020-02-06 NOTE — Progress Notes (Signed)
   Subjective:    Patient ID: Peter Daniels, male    DOB: 20-Oct-1945, 74 y.o.   MRN: [id number]  HPI He has a 2-day history of noticing a rash mainly on his lower extremities that is quite pruritic in nature.  The only new medication he is taking is Septra.  He cannot relate temporally taking the medicine and having the rash.  He does not complaint of difficulty with breathing.   Review of Systems     Objective:   Physical Exam Alert and in no distress.  The dorsum of the hands is slightly erythematous with some wheal-and-flare on the right hand and wheal-and-flare on his lower extremities but not hunting his abdomen or back.  He is not tachypneic. Exam of the feet does show resolving infection     Assessment & Plan:  Allergic reaction to drug, initial encounter - Plan: predniSONE (STERAPRED UNI-PAK 21 TAB) 10 MG (21) TBPK tablet, methylPREDNISolone sodium succinate (SOLU-MEDROL) 125 mg/2 mL injection 125 mg  Cellulitis of left lower extremity Recommend he use Benadryl for the itching.  Discussed possible side effects of the steroid including elevated blood sugars.  If he has any questions, he is to call me.  It is guilt by association swelling to the blame this on Septra.  He is to throw the rest of the Septra away as it looks like it works to get rid of the infection in his toes.

## 2020-02-06 NOTE — Telephone Encounter (Signed)
Pt states that he got shot in office and started on predisone and it has helped clear up the blisters that he had but now he has new areas all over arms and legs that have blisters. Please advise

## 2020-02-06 NOTE — Telephone Encounter (Signed)
Check with him tomorrow

## 2020-02-07 ENCOUNTER — Telehealth: Payer: Self-pay | Admitting: Family Medicine

## 2020-02-07 NOTE — Telephone Encounter (Signed)
Pt called and wanted to know if there was anything stronger he could take besides Benadryl because its not helping with his itching.

## 2020-02-07 NOTE — Telephone Encounter (Signed)
Have him take Tagamet as well as Benadryl.  Tagamet works for itching as well as in the stomach.

## 2020-02-07 NOTE — Telephone Encounter (Signed)
Pt is coming I tomorrow. [place]

## 2020-02-08 ENCOUNTER — Other Ambulatory Visit: Payer: Self-pay

## 2020-02-08 ENCOUNTER — Ambulatory Visit (INDEPENDENT_AMBULATORY_CARE_PROVIDER_SITE_OTHER): Payer: Medicare Other | Admitting: Family Medicine

## 2020-02-08 ENCOUNTER — Encounter: Payer: Self-pay | Admitting: Family Medicine

## 2020-02-08 VITALS — BP 128/74 | HR 88 | Temp 97.0°F | Wt 209.0 lb

## 2020-02-08 DIAGNOSIS — T7840XA Allergy, unspecified, initial encounter: Secondary | ICD-10-CM | POA: Diagnosis not present

## 2020-02-08 DIAGNOSIS — E118 Type 2 diabetes mellitus with unspecified complications: Secondary | ICD-10-CM

## 2020-02-08 DIAGNOSIS — L03116 Cellulitis of left lower limb: Secondary | ICD-10-CM

## 2020-02-08 MED ORDER — AMOXICILLIN-POT CLAVULANATE 875-125 MG PO TABS: 1.0000 | ORAL_TABLET | Freq: Two times a day (BID) | ORAL | 0 refills | Status: DC

## 2020-02-08 NOTE — Progress Notes (Signed)
   Subjective:    Patient ID: Peter Daniels, male    DOB: [birth year], 74 y.o.   MRN: [id number]  HPI He is here for a recheck.  The allergic reaction symptoms have finally gone away.  He did have a rough day yesterday but is doing much better today.  He states that foot seems to be holding its own.   Review of Systems     Objective:   Physical Exam Alert and in no distress.  No wheal-and-flare reaction is noted.  Exam of the left foot does show a slight purplish appearance to the lateral toes but is not warm or tender.       Assessment & Plan:  Cellulitis of left lower extremity - Plan: amoxicillin-clavulanate (AUGMENTIN) 875-125 MG tablet  Allergic reaction to drug, initial encounter  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[place]) Since he does have the underlying diabetes I think is appropriate to cover him with an antibiotic and recheck this in about 2 weeks.  A picture was taken of it.

## 2020-02-19 ENCOUNTER — Encounter: Payer: Self-pay | Admitting: Family Medicine

## 2020-02-19 ENCOUNTER — Other Ambulatory Visit: Payer: Self-pay

## 2020-02-19 ENCOUNTER — Ambulatory Visit (INDEPENDENT_AMBULATORY_CARE_PROVIDER_SITE_OTHER): Payer: Medicare Other | Admitting: Family Medicine

## 2020-02-19 VITALS — BP 122/78 | HR 75 | Temp 97.6°F | Wt 207.4 lb

## 2020-02-19 DIAGNOSIS — L03116 Cellulitis of left lower limb: Secondary | ICD-10-CM

## 2020-02-19 NOTE — Progress Notes (Signed)
   Subjective:    Patient ID: Peter Daniels, male    DOB: Oct 04, 1945, 74 y.o.   MRN: [id number]  HPI He is here for follow-up evaluation of cellulitis of the foot.  He now states that it is back to normal.   Review of Systems     Objective:   Physical Exam Exam of the left foot shows only minimal erythema between the fourth and fifth toes otherwise totally normal       Assessment & Plan:  Cellulitis of left lower extremity I explained that he is now back to normal and he will keep his regular diabetes appointments.

## 2020-02-20 DIAGNOSIS — H35362 Drusen (degenerative) of macula, left eye: Secondary | ICD-10-CM | POA: Diagnosis not present

## 2020-02-20 DIAGNOSIS — E119 Type 2 diabetes mellitus without complications: Secondary | ICD-10-CM | POA: Diagnosis not present

## 2020-02-20 DIAGNOSIS — H25813 Combined forms of age-related cataract, bilateral: Secondary | ICD-10-CM | POA: Diagnosis not present

## 2020-02-20 DIAGNOSIS — H43813 Vitreous degeneration, bilateral: Secondary | ICD-10-CM | POA: Diagnosis not present

## 2020-03-28 DIAGNOSIS — H25013 Cortical age-related cataract, bilateral: Secondary | ICD-10-CM | POA: Diagnosis not present

## 2020-03-28 DIAGNOSIS — H18413 Arcus senilis, bilateral: Secondary | ICD-10-CM | POA: Diagnosis not present

## 2020-03-28 DIAGNOSIS — H2513 Age-related nuclear cataract, bilateral: Secondary | ICD-10-CM | POA: Diagnosis not present

## 2020-03-28 DIAGNOSIS — E119 Type 2 diabetes mellitus without complications: Secondary | ICD-10-CM | POA: Diagnosis not present

## 2020-04-17 ENCOUNTER — Other Ambulatory Visit: Payer: Self-pay | Admitting: Family Medicine

## 2020-04-17 DIAGNOSIS — E785 Hyperlipidemia, unspecified: Secondary | ICD-10-CM

## 2020-04-17 DIAGNOSIS — E1169 Type 2 diabetes mellitus with other specified complication: Secondary | ICD-10-CM

## 2020-04-17 DIAGNOSIS — I152 Hypertension secondary to endocrine disorders: Secondary | ICD-10-CM

## 2020-04-17 DIAGNOSIS — E1159 Type 2 diabetes mellitus with other circulatory complications: Secondary | ICD-10-CM

## 2020-05-03 ENCOUNTER — Other Ambulatory Visit (HOSPITAL_BASED_OUTPATIENT_CLINIC_OR_DEPARTMENT_OTHER): Payer: Self-pay | Admitting: Internal Medicine

## 2020-05-03 ENCOUNTER — Ambulatory Visit: Payer: Medicare Other | Attending: Internal Medicine

## 2020-05-03 DIAGNOSIS — Z23 Encounter for immunization: Secondary | ICD-10-CM

## 2020-05-06 NOTE — Progress Notes (Signed)
° °  Covid-[facility] Vaccination [facility]  Name:  Peter Daniels    MRN: [id number] DOB: 30-Jan-1946  05/06/2020  Peter Daniels was observed post Covid-19 immunization for 15 minutes without incident. He was provided with Vaccine Information Sheet and instruction to access the V-Safe system.   Peter Daniels was instructed to call 911 with any severe reactions post vaccine:  Difficulty breathing   Swelling of face and throat   A fast heartbeat   A bad rash all over body   Dizziness and weakness

## 2020-05-10 MED FILL — PFIZER-BIONTECH COVID-19 VA: 30 | 1 days supply | Qty: 0 | Fill #0

## 2020-08-06 ENCOUNTER — Other Ambulatory Visit: Payer: Self-pay | Admitting: Family Medicine

## 2020-08-06 DIAGNOSIS — E785 Hyperlipidemia, unspecified: Secondary | ICD-10-CM

## 2020-08-06 DIAGNOSIS — E1159 Type 2 diabetes mellitus with other circulatory complications: Secondary | ICD-10-CM

## 2020-08-06 DIAGNOSIS — E1169 Type 2 diabetes mellitus with other specified complication: Secondary | ICD-10-CM

## 2020-11-18 ENCOUNTER — Encounter: Payer: Self-pay | Admitting: Family Medicine

## 2020-11-18 ENCOUNTER — Ambulatory Visit (INDEPENDENT_AMBULATORY_CARE_PROVIDER_SITE_OTHER): Payer: Medicare Other | Admitting: Family Medicine

## 2020-11-18 VITALS — BP 134/86 | HR 86 | Temp 98.0°F | Ht 68.5 in | Wt 221.0 lb

## 2020-11-18 DIAGNOSIS — E118 Type 2 diabetes mellitus with unspecified complications: Secondary | ICD-10-CM

## 2020-11-18 DIAGNOSIS — I700 Atherosclerosis of aorta: Secondary | ICD-10-CM | POA: Diagnosis not present

## 2020-11-18 DIAGNOSIS — L84 Corns and callosities: Secondary | ICD-10-CM | POA: Diagnosis not present

## 2020-11-18 DIAGNOSIS — Z86010 Personal history of colonic polyps: Secondary | ICD-10-CM

## 2020-11-18 DIAGNOSIS — E1169 Type 2 diabetes mellitus with other specified complication: Secondary | ICD-10-CM

## 2020-11-18 DIAGNOSIS — L309 Dermatitis, unspecified: Secondary | ICD-10-CM

## 2020-11-18 DIAGNOSIS — E785 Hyperlipidemia, unspecified: Secondary | ICD-10-CM | POA: Diagnosis not present

## 2020-11-18 DIAGNOSIS — I152 Hypertension secondary to endocrine disorders: Secondary | ICD-10-CM

## 2020-11-18 DIAGNOSIS — B351 Tinea unguium: Secondary | ICD-10-CM | POA: Diagnosis not present

## 2020-11-18 DIAGNOSIS — L570 Actinic keratosis: Secondary | ICD-10-CM | POA: Insufficient documentation

## 2020-11-18 DIAGNOSIS — E1159 Type 2 diabetes mellitus with other circulatory complications: Secondary | ICD-10-CM

## 2020-11-18 LAB — POCT GLYCOSYLATED HEMOGLOBIN (HGB A1C): Hemoglobin A1C: 6 % — AB (ref 4.0–5.6)

## 2020-11-18 NOTE — Progress Notes (Signed)
Peter Daniels is a 75 y.o. male who presents for annual wellness visit and follow-up on chronic medical conditions.  He has no particular concerns or complaints.  He continues on atorvastatin as well as lisinopril and is having no difficulty with them.  He has gotten little more lax in his diet and has gained some of his weight back.  He is not exercise.  Quit smoking several decades ago.  Review of the record indicates need for for Tdap and Shingrix.  He also has an eye exam set up in the near future.  He did have an adenomatous polyp and is scheduled to follow-up with GI this year.  Immunizations and Health Maintenance Immunization History  Administered Date(s) Administered  . Influenza Split 07/09/2011  . Influenza, High Dose Seasonal PF 05/01/2014, 05/13/2015  . PFIZER(Purple Top)SARS-COV-2 Vaccination 05/03/2020  . Pneumococcal Conjugate-13 05/01/2014  . Pneumococcal Polysaccharide-23 03/10/2013  . Tdap 09/05/2009   Health Maintenance Due  Topic Date Due  . TETANUS/TDAP  09/06/2019  . OPHTHALMOLOGY EXAM  03/08/2020  . HEMOGLOBIN A1C  05/19/2020  . COVID-19 Vaccine (2 - Pfizer 3-dose series) 05/24/2020    Last colonoscopy: 03/10/13 Last PSA: N/A Dentist:N/A Ophtho: Q year Exercise: walking at home for three hour QD  Other doctors caring for patient include: Dr. Carlean [facility]  Advanced Directives: Does Patient Have a Medical Advance Directive?: No Would patient like information on creating a medical advance directive?: Yes (ED - Information included in AVS)  Depression screen:  See questionnaire below.     Depression screen [facility] 2/9 11/18/2020 11/17/2019 11/17/2018 11/15/2017 01/23/2016  Decreased Interest 0 0 0 0 0  Down, Depressed, Hopeless 0 0 0 1 0  PHQ - 2 Score 0 0 0 1 0    Fall Screen: See Questionaire below.   Fall Risk  11/18/2020 01/31/2020 11/17/2019 11/17/2018 11/15/2017  Falls in the past year? 0 0 0 0 No  Comment - - - - -  Number falls in past yr: 0 - - - -  Injury  with Fall? 0 - - - -  Risk for fall due to : No Fall Risks - - - -  Follow up Falls evaluation completed - - - -    ADL screen:  See questionnaire below.  Functional Status Survey: Is the patient deaf or have difficulty hearing?: No Does the patient have difficulty seeing, even when wearing glasses/contacts?: No Does the patient have difficulty concentrating, remembering, or making decisions?: Yes Does the patient have difficulty walking or climbing stairs?: No Does the patient have difficulty dressing or bathing?: No Does the patient have difficulty doing errands alone such as visiting a doctor's office or shopping?: No   Review of Systems  Constitutional: -, -unexpected weight change, -anorexia, -fatigue Allergy: -sneezing, -itching, -congestion Dermatology: denies changing moles, rash, lumps ENT: -runny nose, -ear pain, -sore throat,  Cardiology:  -chest pain, -palpitations, -orthopnea, Respiratory: -cough, -shortness of breath, -dyspnea on exertion, -wheezing,  Gastroenterology: -abdominal pain, -nausea, -vomiting, -diarrhea, -constipation, -dysphagia Hematology: -bleeding or bruising problems Musculoskeletal: -arthralgias, -myalgias, -joint swelling, -back pain, - Ophthalmology: -vision changes,  Urology: -dysuria, -difficulty urinating,  -urinary frequency, -urgency, incontinence Neurology: -, -numbness, , -memory loss, -falls, -dizziness    PHYSICAL EXAM:   General Appearance: Alert, cooperative, no distress, appears stated age Head: Normocephalic, without obvious abnormality, atraumatic Eyes: PERRL, conjunctiva/corneas clear, EOM's intact, fundi benign Ears: Normal TM's and external ear canals Nose: Nares normal, mucosa normal, no drainage or sinus   tenderness Throat:  Lips, mucosa, and tongue normal; teeth and gums normal Neck: Supple, no lymphadenopathy, thyroid:no enlargement/tenderness/nodules; no carotid bruit or JVD Lungs: Clear to auscultation bilaterally  without wheezes, rales or ronchi; respirations unlabored Heart: Regular rate and rhythm, S1 and S2 normal, no murmur, rub or gallop Abdomen: Soft, non-tender, nondistended, normoactive bowel sounds, no masses, no hepatosplenomegaly Extremities: No clubbing, cyanosis or edema diabetic foot exam is normal callus present on right foot. Pulses: 2+ and symmetric all extremities Skin: Skin on the face does show multiple erythematous scaly lesions. Lymph nodes: Cervical, supraclavicular, and axillary nodes normal Neurologic: CNII-XII intact, normal strength, sensation and gait; reflexes 2+ and symmetric throughout   Psych: Normal mood, affect, hygiene and grooming Hemoglobin A1c is 6.0 ASSESSMENT/PLAN: 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HCC) - Plan: POCT glycosylated hemoglobin (Hb A1C)  Hyperlipidemia associated with type 2 diabetes mellitus (HCC)  Eczema, unspecified type  Atherosclerosis of aorta (HCC)  Hypertension associated with diabetes ([place])  Hx of colonic polyps  Callus of foot  Actinic keratosis Discussed the fact that even though his hemoglobin A1c looks good, proper dietary intervention is certainly a good idea and recommend that he lose to 20 so pounds.  We will also refer him back to gastroenterology and follow-up with dermatology as well as podiatry Immunization recommendations discussed.  Colonoscopy recommendations reviewed.   Medicare Attestation I have personally reviewed: The patient's medical and social history Their use of alcohol, tobacco or illicit drugs Their current medications and supplements The patient's functional ability including ADLs,fall risks, home safety risks, cognitive, and hearing and visual impairment Diet and physical activities Evidence for depression or mood disorders  The patient's weight, height, and BMI have been recorded in the chart.  I have made referrals, counseling, and provided education to the patient based on review of the  above and I have provided the patient with a written personalized care plan for preventive services.     Jill Alexanders, MD   11/18/2020

## 2020-11-19 LAB — CBC WITH DIFFERENTIAL/PLATELET
Basophils Absolute: 0 10*3/uL (ref 0.0–0.2)
Basos: 1 %
EOS (ABSOLUTE): 0.2 10*3/uL (ref 0.0–0.4)
Eos: 2 %
Hematocrit: 44.7 % (ref 37.5–51.0)
Hemoglobin: 15 g/dL (ref 13.0–17.7)
Immature Grans (Abs): 0 10*3/uL (ref 0.0–0.1)
Immature Granulocytes: 0 %
Lymphocytes Absolute: 1.9 10*3/uL (ref 0.7–3.1)
Lymphs: 25 %
MCH: 30.9 pg (ref 26.6–33.0)
MCHC: 33.6 g/dL (ref 31.5–35.7)
MCV: 92 fL (ref 79–97)
Monocytes Absolute: 0.5 10*3/uL (ref 0.1–0.9)
Monocytes: 6 %
Neutrophils Absolute: 5.2 10*3/uL (ref 1.4–7.0)
Neutrophils: 66 %
Platelets: 208 10*3/uL (ref 150–450)
RBC: 4.86 x10E6/uL (ref 4.14–5.80)
RDW: 11.8 % (ref 11.6–15.4)
WBC: 7.8 10*3/uL (ref 3.4–10.8)

## 2020-11-19 LAB — LIPID PANEL
Chol/HDL Ratio: 2.3 ratio (ref 0.0–5.0)
Cholesterol, Total: 180 mg/dL (ref 100–199)
HDL: 79 mg/dL (ref >39)
LDL Chol Calc (NIH): 89 mg/dL (ref 0–99)
Triglycerides: 66 mg/dL (ref 0–149)
VLDL Cholesterol Cal: 12 mg/dL (ref 5–40)

## 2020-11-19 LAB — COMPREHENSIVE METABOLIC PANEL
ALT: 28 IU/L (ref 0–44)
AST: 23 IU/L (ref 0–40)
Albumin/Globulin Ratio: 1.8 (ref 1.2–2.2)
Albumin: 4.9 g/dL — ABNORMAL HIGH (ref 3.7–4.7)
Alkaline Phosphatase: 59 IU/L (ref 44–121)
BUN/Creatinine Ratio: 15 (ref 10–24)
BUN: 13 mg/dL (ref 8–27)
Bilirubin Total: 2.1 mg/dL — ABNORMAL HIGH (ref 0.0–1.2)
CO2: 21 mmol/L (ref 20–29)
Calcium: 9.9 mg/dL (ref 8.6–10.2)
Chloride: 100 mmol/L (ref 96–106)
Creatinine, Ser: 0.86 mg/dL (ref 0.76–1.27)
Globulin, Total: 2.7 g/dL (ref 1.5–4.5)
Glucose: 130 mg/dL — ABNORMAL HIGH (ref 65–99)
Potassium: 4.8 mmol/L (ref 3.5–5.2)
Sodium: 138 mmol/L (ref 134–144)
Total Protein: 7.6 g/dL (ref 6.0–8.5)
eGFR: 90 mL/min/{1.73_m2} (ref >59)

## 2020-11-19 NOTE — Addendum Note (Signed)
Addended by: Denita Lung on: 11/19/2020 12:30 PM   Modules accepted: Level of Service

## 2020-12-18 ENCOUNTER — Encounter: Payer: Self-pay | Admitting: Podiatry

## 2020-12-18 ENCOUNTER — Other Ambulatory Visit: Payer: Self-pay

## 2020-12-18 ENCOUNTER — Ambulatory Visit (INDEPENDENT_AMBULATORY_CARE_PROVIDER_SITE_OTHER): Payer: Medicare Other | Admitting: Podiatry

## 2020-12-18 DIAGNOSIS — E119 Type 2 diabetes mellitus without complications: Secondary | ICD-10-CM | POA: Diagnosis not present

## 2020-12-18 DIAGNOSIS — E118 Type 2 diabetes mellitus with unspecified complications: Secondary | ICD-10-CM | POA: Diagnosis not present

## 2020-12-18 DIAGNOSIS — L84 Corns and callosities: Secondary | ICD-10-CM

## 2020-12-24 NOTE — Progress Notes (Signed)
Subjective: Peter Daniels presents today referred by Peter Lung, MD for diabetic foot evaluation. Patient c/o callus plantar aspect of left foot present for some time. He denies any redness, drainage or swelling. Denies any attempt at treatment.  He has had diabetes for several years.  Patient denies any history of foot wounds.  Patient denies symptoms of numbness in feet.  Patient denies symptoms of tingling in feet.  Patient denies symptoms of burning in feet.  Patient denies symptoms of pins/needles sensations in feet.  PCP is Peter Lung, MD , and last visit was 11/18/2020.  Today, patient c/o of callus of foot which interferes with daily activities.  Pain is aggravated when weightbearing with or without wearing enclosed shoe gear.   Past Medical History:  Diagnosis Date   Cataract    Diabetes mellitus without complication ([place])    Hx of colonic polyps 01/19/2018   Hyperlipidemia    Hypertension     Patient Active Problem List   Diagnosis Date Noted   Actinic keratosis 11/18/2020   Atherosclerosis of aorta ([place]) 11/17/2018   Hx of colonic polyps 01/19/2018   Hypertension associated with diabetes ([place]) 01/23/2016   Eczema 01/08/2015   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[place]) 21/19/4174   Hyperlipidemia associated with type 2 diabetes mellitus ([place]) 03/01/2014    Past Surgical History:  Procedure Laterality Date   none      Current Outpatient Medications on File Prior to Visit  Medication Sig Dispense Refill   amoxicillin-clavulanate (AUGMENTIN) 875-125 MG tablet Take 1 tablet by mouth 2 (two) times daily. (Patient not taking: No sig reported) 20 tablet 0   Ascorbic Acid (VITAMIN C) 1000 MG tablet Take 1,000 mg by mouth daily.     atorvastatin (LIPITOR) 20 MG tablet TAKE ONE TABLET BY MOUTH ONE TIME DAILY 90 tablet 0   Cholecalciferol (VITAMIN D) 50 MCG (2000 UT) tablet Take 2,000 Units by mouth daily.     diphenhydrAMINE HCl, Sleep, (ZZZQUIL PO)  Take by mouth.     FOLIC ACID PO Take 1 tablet by mouth.     lisinopril (ZESTRIL) 5 MG tablet TAKE ONE TABLET BY MOUTH ONE TIME DAILY 90 tablet 0   Multiple Vitamins-Minerals (MULTIVITAMIN ADULTS 50+) TABS Take by mouth.     mupirocin ointment (BACTROBAN) 2 % Apply topically 2 (two) times daily. (Patient not taking: Reported on 11/18/2020)     ondansetron (ZOFRAN ODT) 4 MG disintegrating tablet Take 1 tablet (4 mg total) by mouth every 8 (eight) hours as needed for nausea or vomiting. (Patient not taking: No sig reported) 12 tablet 0   predniSONE (STERAPRED UNI-PAK 21 TAB) 10 MG (21) TBPK tablet Take as per manufacturer's recommendations (Patient not taking: No sig reported) 21 tablet 0   No current facility-administered medications on file prior to visit.     Allergies  Allergen Reactions   Sulfa Antibiotics Rash    Wheal-and-flare reaction noted    Social History   Occupational History   Not on file  Tobacco Use   Smoking status: Former    Years: 25.00    Pack years: 0.00    Types: Cigarettes    Quit date: 01/08/1995    Years since quitting: 25.9   Smokeless tobacco: Never  Vaping Use   Vaping Use: Never used  Substance and Sexual Activity   Alcohol use: Yes    Alcohol/week: 20.0 standard drinks    Types: 20 Shots of liquor per week    Comment: 4  drinks/day   Drug use: No   Sexual activity: Not Currently    Family History  Problem Relation Age of Onset   Asthma Other    Cancer Other    Hypertension Other    Colon cancer Neg Hx    Esophageal cancer Neg Hx    Liver cancer Neg Hx    Pancreatic cancer Neg Hx    Rectal cancer Neg Hx    Stomach cancer Neg Hx     Immunization History  Administered Date(s) Administered   Influenza Split 07/09/2011   Influenza, High Dose Seasonal PF 05/01/2014, 05/13/2015   PFIZER(Purple Top)SARS-COV-2 Vaccination 07/21/2019, 08/11/2019, 05/03/2020   Pneumococcal Conjugate-13 05/01/2014   Pneumococcal Polysaccharide-23 03/10/2013    Tdap 09/05/2009, 01/31/2020    Objective: There were no vitals filed for this visit.  Peter Daniels is a pleasant 75 y.o. male in NAD. AAO X 3.  Vascular Examination: Capillary fill time to digits <3 seconds b/l lower extremities. Palpable pedal pulses b/l LE. Pedal hair sparse. Lower extremity skin temperature gradient within normal limits. No pain with calf compression b/l.  Dermatological Examination: Toenails 1-5 b/l elongated, discolored, dystrophic, thickened, crumbly with subungual debris and tenderness to dorsal palpation. Hyperkeratotic lesion(s) plantar aspect of left foot.  No erythema, no edema, no drainage, no fluctuance.  Musculoskeletal Examination: Normal muscle strength 5/5 to all lower extremity muscle groups bilaterally. No pain crepitus or joint limitation noted with ROM b/l. No gross bony deformities bilaterally.  Footwear Assessment: Does the patient wear appropriate shoes? Yes. Does the patient need inserts/orthotics? Yes.  Neurological Examination: Protective sensation intact 5/5 intact bilaterally with 10g monofilament b/l. Vibratory sensation intact b/l.  Lab: Hemoglobin A1C Latest Ref Rng & Units 11/18/2020  HGBA1C 4.0 - 5.6 % 6.0(A)  Some recent data might be hidden   Assessment: 1. Callus   2. 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[employer])   3. Encounter for diabetic foot exam ([place])    ADA Risk Categorization:  Low Risk:  Patient has all of the following: Intact protective sensation No prior foot ulcer  No severe deformity Pedal pulses present  Plan: -Examined patient. -Diabetic foot examination performed today. -Patient to continue soft, supportive shoe gear daily. -Callus(es) left foot pared utilizing sterile scalpel blade without complication or incident. Total number debrided =1. -Patient to report any pedal injuries to medical professional immediately. -Patient/POA to call should there be question/concern in the interim.  Return  in about 3 months (around 03/20/2021).  Marzetta Board, DPM

## 2021-01-08 ENCOUNTER — Encounter: Payer: Self-pay | Admitting: Family Medicine

## 2021-01-08 ENCOUNTER — Other Ambulatory Visit: Payer: Self-pay

## 2021-01-08 DIAGNOSIS — E1159 Type 2 diabetes mellitus with other circulatory complications: Secondary | ICD-10-CM

## 2021-01-08 DIAGNOSIS — E785 Hyperlipidemia, unspecified: Secondary | ICD-10-CM

## 2021-01-08 MED ORDER — ATORVASTATIN CALCIUM 20 MG PO TABS: 20.0000 mg | ORAL_TABLET | Freq: Every day | ORAL | 1 refills | Status: DC

## 2021-01-08 MED ORDER — LISINOPRIL 5 MG PO TABS: 5.0000 mg | ORAL_TABLET | Freq: Every day | ORAL | 1 refills | Status: DC

## 2021-01-09 ENCOUNTER — Other Ambulatory Visit: Payer: Self-pay | Admitting: Family Medicine

## 2021-01-09 DIAGNOSIS — E785 Hyperlipidemia, unspecified: Secondary | ICD-10-CM

## 2021-01-09 DIAGNOSIS — I152 Hypertension secondary to endocrine disorders: Secondary | ICD-10-CM

## 2021-01-09 DIAGNOSIS — E1159 Type 2 diabetes mellitus with other circulatory complications: Secondary | ICD-10-CM

## 2021-01-09 DIAGNOSIS — E1169 Type 2 diabetes mellitus with other specified complication: Secondary | ICD-10-CM

## 2021-01-30 ENCOUNTER — Encounter: Payer: Self-pay | Admitting: Family Medicine

## 2021-03-26 ENCOUNTER — Other Ambulatory Visit: Payer: Self-pay

## 2021-03-26 ENCOUNTER — Ambulatory Visit (INDEPENDENT_AMBULATORY_CARE_PROVIDER_SITE_OTHER): Payer: Medicare Other | Admitting: Podiatry

## 2021-03-26 ENCOUNTER — Encounter: Payer: Self-pay | Admitting: Podiatry

## 2021-03-26 DIAGNOSIS — B351 Tinea unguium: Secondary | ICD-10-CM

## 2021-03-26 DIAGNOSIS — E1142 Type 2 diabetes mellitus with diabetic polyneuropathy: Secondary | ICD-10-CM | POA: Diagnosis not present

## 2021-03-26 DIAGNOSIS — M79674 Pain in right toe(s): Secondary | ICD-10-CM | POA: Diagnosis not present

## 2021-03-26 DIAGNOSIS — M79675 Pain in left toe(s): Secondary | ICD-10-CM | POA: Diagnosis not present

## 2021-03-26 DIAGNOSIS — Q828 Other specified congenital malformations of skin: Secondary | ICD-10-CM

## 2021-03-30 NOTE — Progress Notes (Signed)
  Subjective:  Patient ID: Peter Daniels, male    DOB: [birth year],  MRN: [id number]  Peter Daniels presents to clinic today for preventative diabetic foot care and callus(es) right foot and painful thick toenails that are difficult to trim. Painful toenails interfere with ambulation. Aggravating factors include wearing enclosed shoe gear. Pain is relieved with periodic professional debridement. Painful calluses are aggravated when weightbearing with and without shoegear. Pain is relieved with periodic professional debridement.  Patient does not monitor blood glucose daily.  PCP is Denita Lung, MD , and last visit was 11/18/2020.  Allergies  Allergen Reactions   Sulfa Antibiotics Rash    Wheal-and-flare reaction noted   Review of Systems: Negative except as noted in the HPI. Objective:   Constitutional Peter Daniels is a pleasant 75 y.o. Caucasian male, in NAD. AAO x 3.   Vascular Capillary fill time to digits <3 seconds b/l lower extremities. Palpable DP pulse(s) b/l lower extremities Palpable PT pulse(s) b/l lower extremities Pedal hair sparse. Lower extremity skin temperature gradient within normal limits. No pain with calf compression b/l. No cyanosis or clubbing noted.  Neurologic Normal speech. Oriented to person, place, and time. Protective sensation intact 5/5 intact bilaterally with 10g monofilament b/l.  Dermatologic Skin warm and supple b/l lower extremities. No open wounds b/l lower extremities. No interdigital macerations b/l lower extremities. Toenails 1-5 b/l elongated, discolored, dystrophic, thickened, crumbly with subungual debris and tenderness to dorsal palpation. Porokeratotic lesion(s) submet head 4 right foot. No erythema, no edema, no drainage, no fluctuance.  Orthopedic: Normal muscle strength 5/5 to all lower extremity muscle groups bilaterally. No pain crepitus or joint limitation noted with ROM b/l lower extremities. No gross bony deformities b/l lower  extremities.   Radiographs: None Assessment:  No diagnosis found. Plan:  Patient was evaluated and treated and all questions answered. Consent given for treatment as described below: -Examined patient. -Continue diabetic foot care principles: inspect feet daily, monitor glucose as recommended by PCP and/or Endocrinologist, and follow prescribed diet per PCP, Endocrinologist and/or dietician. -Patient to continue soft, supportive shoe gear daily. -Toenails 1-5 b/l were debrided in length and girth with sterile nail nippers and dremel without iatrogenic bleeding.  -Painful porokeratotic lesion(s) submet head 4 right foot pared and enucleated with sterile scalpel blade without incident. Total number of lesions debrided=1. -Patient to report any pedal injuries to medical professional immediately. -Patient/POA to call should there be question/concern in the interim.  Return in about 3 months (around 06/25/2021).  Marzetta Board, DPM

## 2021-05-20 ENCOUNTER — Encounter: Payer: Self-pay | Admitting: Family Medicine

## 2021-07-08 ENCOUNTER — Ambulatory Visit: Payer: Medicare Other | Admitting: Podiatry

## 2021-09-10 ENCOUNTER — Ambulatory Visit: Payer: Medicare Other | Admitting: Podiatry

## 2021-09-29 ENCOUNTER — Other Ambulatory Visit: Payer: Self-pay | Admitting: Family Medicine

## 2021-09-29 DIAGNOSIS — E1159 Type 2 diabetes mellitus with other circulatory complications: Secondary | ICD-10-CM

## 2021-09-29 DIAGNOSIS — E1169 Type 2 diabetes mellitus with other specified complication: Secondary | ICD-10-CM

## 2021-11-19 ENCOUNTER — Telehealth: Payer: Self-pay | Admitting: Family Medicine

## 2021-11-19 NOTE — Telephone Encounter (Signed)
Left message for patient to call back and schedule Medicare Annual Wellness Visit (AWV) either virtually or in office. ?I left my number for patient to call [phone]. ? ?Last AWV 11/18/20 ?; please schedule at anytime with health coach ? ? ?

## 2021-11-28 ENCOUNTER — Ambulatory Visit (INDEPENDENT_AMBULATORY_CARE_PROVIDER_SITE_OTHER): Payer: Medicare Other

## 2021-11-28 VITALS — Ht 67.0 in | Wt 220.0 lb

## 2021-11-28 DIAGNOSIS — Z0000 Encounter for general adult medical examination without abnormal findings: Secondary | ICD-10-CM | POA: Diagnosis not present

## 2021-11-28 NOTE — Progress Notes (Signed)
I connected with Peter Daniels today by telephone and verified that I am speaking with the correct person using two identifiers. Location patient: home Location provider: work Persons participating in the virtual visit: Peter Daniels, Peter Durand LPN.   I discussed the limitations, risks, security and privacy concerns of performing an evaluation and management service by telephone and the availability of in person appointments. I also discussed with the patient that there may be a patient responsible charge related to this service. The patient expressed understanding and verbally consented to this telephonic visit.    Interactive audio and video telecommunications were attempted between this provider and patient, however failed, due to patient having technical difficulties OR patient did not have access to video capability.  We continued and completed visit with audio only.     Vital signs may be patient reported or missing.  Subjective:   Peter Daniels is a 76 y.o. male who presents for Medicare Annual/Subsequent preventive examination.  Review of Systems     Cardiac Risk Factors include: advanced age (>52mn, >>54women);diabetes mellitus;dyslipidemia;hypertension;male gender;obesity (BMI >30kg/m2)     Objective:    Today's Vitals   11/28/21 0911  Weight: 220 lb (99.8 kg)  Height: '5\' 7"'$  (1.702 m)  PainSc: 2    Body mass index is 34.46 kg/m.     11/28/2021    9:16 AM 11/18/2020    9:40 AM 11/17/2019   10:26 AM 11/17/2018    9:56 AM 01/23/2016   10:34 AM 02/27/2014   12:42 PM  Advanced Directives  Does Patient Have a Medical Advance Directive? No No No No No No  Would patient like information on creating a medical advance directive?  Yes (ED - Information included in AVS) Yes (MAU/Ambulatory/Procedural Areas - Information given) Yes (MAU/Ambulatory/Procedural Areas - Information given) Yes - Educational materials given No - patient declined information    Current  Medications (verified) Outpatient Encounter Medications as of 11/28/2021  Medication Sig   Ascorbic Acid (VITAMIN C) 1000 MG tablet Take 1,000 mg by mouth daily.   atorvastatin (LIPITOR) 20 MG tablet TAKE ONE TABLET BY MOUTH ONCE DAILY   Cholecalciferol (VITAMIN D) 50 MCG (2000 UT) tablet Take 2,000 Units by mouth daily.   diphenhydrAMINE HCl, Sleep, (ZZZQUIL PO) Take by mouth.   FOLIC ACID PO Take 1 tablet by mouth.   lisinopril (ZESTRIL) 5 MG tablet TAKE ONE TABLET BY MOUTH ONCE DAILY   Multiple Vitamins-Minerals (MULTIVITAMIN ADULTS 50+) TABS Take by mouth.   amoxicillin-clavulanate (AUGMENTIN) 875-125 MG tablet Take 1 tablet by mouth 2 (two) times daily. (Patient not taking: Reported on 02/19/2020)   mupirocin ointment (BACTROBAN) 2 % Apply topically 2 (two) times daily. (Patient not taking: Reported on 11/18/2020)   ondansetron (ZOFRAN ODT) 4 MG disintegrating tablet Take 1 tablet (4 mg total) by mouth every 8 (eight) hours as needed for nausea or vomiting. (Patient not taking: Reported on 02/19/2020)   predniSONE (STERAPRED UNI-PAK 21 TAB) 10 MG (21) TBPK tablet Take as per manufacturer's recommendations (Patient not taking: Reported on 02/19/2020)   No facility-administered encounter medications on file as of 11/28/2021.    Allergies (verified) Sulfa antibiotics   History: Past Medical History:  Diagnosis Date   Cataract    Diabetes mellitus without complication ([place]    Hx of colonic polyps 01/19/2018   Hyperlipidemia    Hypertension    Past Surgical History:  Procedure Laterality Date   none     Family History  Problem Relation Age of Onset  Asthma Other    Cancer Other    Hypertension Other    Colon cancer Neg Hx    Esophageal cancer Neg Hx    Liver cancer Neg Hx    Pancreatic cancer Neg Hx    Rectal cancer Neg Hx    Stomach cancer Neg Hx    Social History   Socioeconomic History   Marital status: Single    Spouse name: Not on file   Number of children: Not on  file   Years of education: Not on file   Highest education level: Not on file  Occupational History   Not on file  Tobacco Use   Smoking status: Former    Years: 25.00    Types: Cigarettes    Quit date: 01/08/1995    Years since quitting: 26.9   Smokeless tobacco: Never  Vaping Use   Vaping Use: Never used  Substance and Sexual Activity   Alcohol use: Yes    Alcohol/week: 20.0 standard drinks    Types: 20 Shots of liquor per week    Comment: 4 drinks/day   Drug use: No   Sexual activity: Not Currently  Other Topics Concern   Not on file  Social History Narrative   Not on file   Social Determinants of Health   Financial Resource Strain: Low Risk    Difficulty of Paying Living Expenses: Not hard at all  Food Insecurity: No Food Insecurity   Worried About Charity fundraiser in the Last Year: Never true   [place] in the Last Year: Never true  Transportation Needs: No Transportation Needs   Lack of Transportation (Medical): No   Lack of Transportation (Non-Medical): No  Physical Activity: Inactive   Days of Exercise per Week: 0 days   Minutes of Exercise per Session: 0 min  Stress: No Stress Concern Present   Feeling of Stress : Not at all  Social Connections: Not on file    Tobacco Counseling Counseling given: Not Answered   Clinical Intake:  Pre-visit preparation completed: Yes  Pain : 0-10 Pain Score: 2  Pain Type: Acute pain Pain Location: Arm Pain Orientation: Right Pain Descriptors / Indicators: Sore Pain Onset: Yesterday Pain Frequency: Constant     Nutritional Status: BMI > 30  Obese Nutritional Risks: None Diabetes: Yes  How often do you need to have someone help you when you read instructions, pamphlets, or other written materials from your doctor or pharmacy?: 1 - Never What is the last grade level you completed in school?: masters degree  Diabetic? Yes Nutrition Risk Assessment:  Has the patient had any N/V/D within the last 2  months?  No  Does the patient have any non-healing wounds?  No  Has the patient had any unintentional weight loss or weight gain?  No   Diabetes:  Is the patient diabetic?  Yes  If diabetic, was a CBG obtained today?  No  Did the patient bring in their glucometer from home?  No  How often do you monitor your CBG's?  monthly.   Financial Strains and Diabetes Management:  Are you having any financial strains with the device, your supplies or your medication? No .  Does the patient want to be seen by Chronic Care Management for management of their diabetes?  No  Would the patient like to be referred to a Nutritionist or for Diabetic Management?  No   Diabetic Exams:  Diabetic Eye Exam: Overdue for diabetic eye exam. Pt  has been advised about the importance in completing this exam. Patient advised to call and schedule an eye exam. Diabetic Foot Exam: Overdue, Pt has been advised about the importance in completing this exam. Pt is scheduled for diabetic foot exam on next appointment.   Interpreter Needed?: No  Information entered by :: NAllen LPN   Activities of Daily Living    11/28/2021    9:17 AM  In your present state of health, do you have any difficulty performing the following activities:  Hearing? 0  Vision? 0  Difficulty concentrating or making decisions? 1  Comment some memory  Walking or climbing stairs? 0  Dressing or bathing? 0  Doing errands, shopping? 0  Preparing Food and eating ? N  Using the Toilet? N  In the past six months, have you accidently leaked urine? N  Do you have problems with loss of bowel control? N  Managing your Medications? N  Managing your Finances? N  Housekeeping or managing your Housekeeping? N    Patient Care Team: Denita Lung, MD as PCP - General (Family Medicine)  Indicate any recent Medical Services you may have received from other than [facility] providers in the past year (date may be approximate).     Assessment:   This is a  routine wellness examination for Caedyn.  Hearing/Vision screen Vision Screening - Comments:: Regular eye exams, [facility]  Dietary issues and exercise activities discussed: Current Exercise Habits: The patient does not participate in regular exercise at present   Goals Addressed             This Visit's Progress    Patient Stated       11/28/2021, no goals       Depression Screen    11/28/2021    9:17 AM 11/18/2020    9:36 AM 11/17/2019    9:59 AM 11/17/2018    9:37 AM 11/15/2017   10:38 AM 01/23/2016   10:33 AM 01/23/2016   10:01 AM  PHQ 2/9 Scores  PHQ - 2 Score 0 0 0 0 1 0 0    Fall Risk    11/28/2021    9:16 AM 11/18/2020    9:35 AM 01/31/2020    9:09 AM 11/17/2019    9:58 AM 11/17/2018    9:37 AM  Fall Risk   Falls in the past year? 1 0 0 0 0  Comment pulled by dog      Number falls in past yr: 1 0     Injury with Fall? 0 0     Risk for fall due to : Medication side effect No Fall Risks     Follow up Falls evaluation completed;Education provided;Falls prevention discussed Falls evaluation completed       FALL RISK PREVENTION PERTAINING TO THE HOME:  Any stairs in or around the home? Yes  If so, are there any without handrails? No  Home free of loose throw rugs in walkways, pet beds, electrical cords, etc? Yes  Adequate lighting in your home to reduce risk of falls? Yes   ASSISTIVE DEVICES UTILIZED TO PREVENT FALLS:  Life alert? No  Use of a cane, walker or w/c? No  Grab bars in the bathroom? Yes  Shower chair or bench in shower? No  Elevated toilet seat or a handicapped toilet? Yes   TIMED UP AND GO:  Was the test performed? No .      Cognitive Function:        11/28/2021  9:18 AM  6CIT Screen  What Year? 0 points  What month? 0 points  What time? 0 points  Count back from 20 0 points  Months in reverse 0 points  Repeat phrase 4 points  Total Score 4 points    Immunizations Immunization History  Administered Date(s) Administered    Influenza Split 07/09/2011   Influenza, High Dose Seasonal PF 05/01/2014, 05/13/2015   PFIZER(Purple Top)SARS-COV-2 Vaccination 07/21/2019, 08/11/2019, 05/03/2020   Pneumococcal Conjugate-13 05/01/2014   Pneumococcal Polysaccharide-23 03/10/2013   Tdap 09/05/2009, 01/31/2020    TDAP status: Up to date  Flu Vaccine status: Up to date  Pneumococcal vaccine status: Up to date  Covid-19 vaccine status: Completed vaccines  Qualifies for Shingles Vaccine? Yes   Zostavax completed No   Shingrix Completed?: No.    Education has been provided regarding the importance of this vaccine. Patient has been advised to call insurance company to determine out of pocket expense if they have not yet received this vaccine. Advised may also receive vaccine at local pharmacy or Health Dept. Verbalized acceptance and understanding.  Screening Tests Health Maintenance  Topic Date Due   Zoster Vaccines- Shingrix (1 of 2) Never done   OPHTHALMOLOGY EXAM  03/08/2020   COVID-19 Vaccine (4 - Booster for Pfizer series) 06/28/2020   COLONOSCOPY (Pts 45-66yr Insurance coverage will need to be confirmed)  01/13/2021   HEMOGLOBIN A1C  05/21/2021   FOOT EXAM  11/18/2021   INFLUENZA VACCINE  02/03/2022   TETANUS/TDAP  01/30/2030   Pneumonia Vaccine 76 Years old  Completed   Hepatitis C Screening  Completed   HPV VACCINES  Aged Out    Health Maintenance  Health Maintenance Due  Topic Date Due   Zoster Vaccines- Shingrix (1 of 2) Never done   OPHTHALMOLOGY EXAM  03/08/2020   COVID-19 Vaccine (4 - Booster for PGarza-Salinas IIseries) 06/28/2020   COLONOSCOPY (Pts 45-4101yrInsurance coverage will need to be confirmed)  01/13/2021   HEMOGLOBIN A1C  05/21/2021   FOOT EXAM  11/18/2021    Colorectal cancer screening: Type of screening: Colonoscopy. Completed 01/13/2018. Repeat every 3 years  Lung Cancer Screening: (Low Dose CT Chest recommended if Age 514-80ears, 30 pack-year currently smoking OR have quit w/in  15years.) does not qualify.   Lung Cancer Screening Referral: no  Additional Screening:  Hepatitis C Screening: does qualify; Completed 11/17/2018  Vision Screening: Recommended annual ophthalmology exams for early detection of glaucoma and other disorders of the eye. Is the patient up to date with their annual eye exam?  Yes  Who is the provider or what is the name of the office in which the patient attends annual eye exams? [facility] If pt is not established with a provider, would they like to be referred to a provider to establish care? No .   Dental Screening: Recommended annual dental exams for proper oral hygiene  Community Resource Referral / Chronic Care Management: CRR required this visit?  No   CCM required this visit?  No      Plan:     I have personally reviewed and noted the following in the patient's chart:   Medical and social history Use of alcohol, tobacco or illicit drugs  Current medications and supplements including opioid prescriptions. Patient is not currently taking opioid prescriptions. Functional ability and status Nutritional status Physical activity Advanced directives List of other physicians Hospitalizations, surgeries, and ER visits in previous 12 months Vitals Screenings to include cognitive, depression, and falls Referrals and  appointments  In addition, I have reviewed and discussed with patient certain preventive protocols, quality metrics, and best practice recommendations. A written personalized care plan for preventive services as well as general preventive health recommendations were provided to patient.     Kellie Simmering, LPN   5/67/0141   Nurse Notes: none  Due to this being a virtual visit, the after visit summary with patients personalized plan was offered to patient via mail or my-chart. Patient would like to access on my-chart

## 2021-11-28 NOTE — Patient Instructions (Signed)
Peter Daniels , Thank you for taking time to come for your Medicare Wellness Visit. I appreciate your ongoing commitment to your health goals. Please review the following plan we discussed and let me know if I can assist you in the future.   Screening recommendations/referrals: Colonoscopy: completed 01/13/2018, due Recommended yearly ophthalmology/optometry visit for glaucoma screening and checkup Recommended yearly dental visit for hygiene and checkup  Vaccinations: Influenza vaccine: due 02/03/2022 Pneumococcal vaccine: completed 05/01/2014 Tdap vaccine: completed 01/31/2020, due 01/30/2030 Shingles vaccine: due   Covid-19:  05/03/2020, 08/11/2019, 07/21/2019  Advanced directives: Advance directive discussed with you today.   Conditions/risks identified: none  Next appointment: Follow up in one year for your annual wellness visit.   Preventive Care 78 Years and Older, Male Preventive care refers to lifestyle choices and visits with your health care provider that can promote health and wellness. What does preventive care include? A yearly physical exam. This is also called an annual well check. Dental exams once or twice a year. Routine eye exams. Ask your health care provider how often you should have your eyes checked. Personal lifestyle choices, including: Daily care of your teeth and gums. Regular physical activity. Eating a healthy diet. Avoiding tobacco and drug use. Limiting alcohol use. Practicing safe sex. Taking low doses of aspirin every day. Taking vitamin and mineral supplements as recommended by your health care provider. What happens during an annual well check? The services and screenings done by your health care provider during your annual well check will depend on your age, overall health, lifestyle risk factors, and family history of disease. Counseling  Your health care provider may ask you questions about your: Alcohol use. Tobacco use. Drug use. Emotional  well-being. Home and relationship well-being. Sexual activity. Eating habits. History of falls. Memory and ability to understand (cognition). Work and work Statistician. Screening  You may have the following tests or measurements: Height, weight, and BMI. Blood pressure. Lipid and cholesterol levels. These may be checked every 5 years, or more frequently if you are over 62 years old. Skin check. Lung cancer screening. You may have this screening every year starting at age 65 if you have a 30-pack-year history of smoking and currently smoke or have quit within the past 15 years. Fecal occult blood test (FOBT) of the stool. You may have this test every year starting at age 66. Flexible sigmoidoscopy or colonoscopy. You may have a sigmoidoscopy every 5 years or a colonoscopy every 10 years starting at age 73. Prostate cancer screening. Recommendations will vary depending on your family history and other risks. Hepatitis C blood test. Hepatitis B blood test. Sexually transmitted disease (STD) testing. Diabetes screening. This is done by checking your blood sugar (glucose) after you have not eaten for a while (fasting). You may have this done every 1-3 years. Abdominal aortic aneurysm (AAA) screening. You may need this if you are a current or former smoker. Osteoporosis. You may be screened starting at age 60 if you are at high risk. Talk with your health care provider about your test results, treatment options, and if necessary, the need for more tests. Vaccines  Your health care provider may recommend certain vaccines, such as: Influenza vaccine. This is recommended every year. Tetanus, diphtheria, and acellular pertussis (Tdap, Td) vaccine. You may need a Td booster every 10 years. Zoster vaccine. You may need this after age 27. Pneumococcal 13-valent conjugate (PCV13) vaccine. One dose is recommended after age 36. Pneumococcal polysaccharide (PPSV23) vaccine. One dose is  recommended after  age 9. Talk to your health care provider about which screenings and vaccines you need and how often you need them. This information is not intended to replace advice given to you by your health care provider. Make sure you discuss any questions you have with your health care provider. Document Released: 07/19/2015 Document Revised: 03/11/2016 Document Reviewed: 04/23/2015 Elsevier Interactive Patient Education  2017 [place] Prevention in the Home Falls can cause injuries. They can happen to people of all ages. There are many things you can do to make your home safe and to help prevent falls. What can I do on the outside of my home? Regularly fix the edges of walkways and driveways and fix any cracks. Remove anything that might make you trip as you walk through a door, such as a raised step or threshold. Trim any bushes or trees on the path to your home. Use bright outdoor lighting. Clear any walking paths of anything that might make someone trip, such as rocks or tools. Regularly check to see if handrails are loose or broken. Make sure that both sides of any steps have handrails. Any raised decks and porches should have guardrails on the edges. Have any leaves, snow, or ice cleared regularly. Use sand or salt on walking paths during winter. Clean up any spills in your garage right away. This includes oil or grease spills. What can I do in the bathroom? Use night lights. Install grab bars by the toilet and in the tub and shower. Do not use towel bars as grab bars. Use non-skid mats or decals in the tub or shower. If you need to sit down in the shower, use a plastic, non-slip stool. Keep the floor dry. Clean up any water that spills on the floor as soon as it happens. Remove soap buildup in the tub or shower regularly. Attach bath mats securely with double-sided non-slip rug tape. Do not have throw rugs and other things on the floor that can make you trip. What can I do in the  bedroom? Use night lights. Make sure that you have a light by your bed that is easy to reach. Do not use any sheets or blankets that are too big for your bed. They should not hang down onto the floor. Have a firm chair that has side arms. You can use this for support while you get dressed. Do not have throw rugs and other things on the floor that can make you trip. What can I do in the kitchen? Clean up any spills right away. Avoid walking on wet floors. Keep items that you use a lot in easy-to-reach places. If you need to reach something above you, use a strong step stool that has a grab bar. Keep electrical cords out of the way. Do not use floor polish or wax that makes floors slippery. If you must use wax, use non-skid floor wax. Do not have throw rugs and other things on the floor that can make you trip. What can I do with my stairs? Do not leave any items on the stairs. Make sure that there are handrails on both sides of the stairs and use them. Fix handrails that are broken or loose. Make sure that handrails are as long as the stairways. Check any carpeting to make sure that it is firmly attached to the stairs. Fix any carpet that is loose or worn. Avoid having throw rugs at the top or bottom of the stairs. If  you do have throw rugs, attach them to the floor with carpet tape. Make sure that you have a light switch at the top of the stairs and the bottom of the stairs. If you do not have them, ask someone to add them for you. What else can I do to help prevent falls? Wear shoes that: Do not have high heels. Have rubber bottoms. Are comfortable and fit you well. Are closed at the toe. Do not wear sandals. If you use a stepladder: Make sure that it is fully opened. Do not climb a closed stepladder. Make sure that both sides of the stepladder are locked into place. Ask someone to hold it for you, if possible. Clearly mark and make sure that you can see: Any grab bars or  handrails. First and last steps. Where the edge of each step is. Use tools that help you move around (mobility aids) if they are needed. These include: Canes. Walkers. Scooters. Crutches. Turn on the lights when you go into a dark area. Replace any light bulbs as soon as they burn out. Set up your furniture so you have a clear path. Avoid moving your furniture around. If any of your floors are uneven, fix them. If there are any pets around you, be aware of where they are. Review your medicines with your doctor. Some medicines can make you feel dizzy. This can increase your chance of falling. Ask your doctor what other things that you can do to help prevent falls. This information is not intended to replace advice given to you by your health care provider. Make sure you discuss any questions you have with your health care provider. Document Released: 04/18/2009 Document Revised: 11/28/2015 Document Reviewed: 07/27/2014 Elsevier Interactive Patient Education  2017 [employer].

## 2021-12-08 ENCOUNTER — Encounter: Payer: Self-pay | Admitting: Family Medicine

## 2022-02-10 ENCOUNTER — Other Ambulatory Visit: Payer: Self-pay | Admitting: Family Medicine

## 2022-02-10 DIAGNOSIS — E1169 Type 2 diabetes mellitus with other specified complication: Secondary | ICD-10-CM

## 2022-02-10 DIAGNOSIS — E1159 Type 2 diabetes mellitus with other circulatory complications: Secondary | ICD-10-CM

## 2022-02-10 NOTE — Telephone Encounter (Signed)
Left VM to see if pt needs refills

## 2022-03-04 ENCOUNTER — Ambulatory Visit (INDEPENDENT_AMBULATORY_CARE_PROVIDER_SITE_OTHER): Payer: Medicare Other | Admitting: Family Medicine

## 2022-03-04 ENCOUNTER — Encounter: Payer: Self-pay | Admitting: Family Medicine

## 2022-03-04 VITALS — BP 140/72 | HR 80 | Temp 97.5°F | Ht 68.75 in | Wt 220.0 lb

## 2022-03-04 DIAGNOSIS — I152 Hypertension secondary to endocrine disorders: Secondary | ICD-10-CM | POA: Diagnosis not present

## 2022-03-04 DIAGNOSIS — D692 Other nonthrombocytopenic purpura: Secondary | ICD-10-CM | POA: Diagnosis not present

## 2022-03-04 DIAGNOSIS — E118 Type 2 diabetes mellitus with unspecified complications: Secondary | ICD-10-CM

## 2022-03-04 DIAGNOSIS — I700 Atherosclerosis of aorta: Secondary | ICD-10-CM

## 2022-03-04 DIAGNOSIS — E1169 Type 2 diabetes mellitus with other specified complication: Secondary | ICD-10-CM

## 2022-03-04 DIAGNOSIS — K029 Dental caries, unspecified: Secondary | ICD-10-CM

## 2022-03-04 DIAGNOSIS — L309 Dermatitis, unspecified: Secondary | ICD-10-CM

## 2022-03-04 DIAGNOSIS — E1159 Type 2 diabetes mellitus with other circulatory complications: Secondary | ICD-10-CM | POA: Diagnosis not present

## 2022-03-04 DIAGNOSIS — E785 Hyperlipidemia, unspecified: Secondary | ICD-10-CM | POA: Diagnosis not present

## 2022-03-04 DIAGNOSIS — Z86010 Personal history of colonic polyps: Secondary | ICD-10-CM | POA: Diagnosis not present

## 2022-03-04 DIAGNOSIS — L570 Actinic keratosis: Secondary | ICD-10-CM

## 2022-03-04 LAB — POCT GLYCOSYLATED HEMOGLOBIN (HGB A1C): Hemoglobin A1C: 6.9 % — AB (ref 4.0–5.6)

## 2022-03-04 LAB — POCT UA - MICROALBUMIN
Albumin/Creatinine Ratio, Urine, POC: 12.1
Creatinine, POC: 158.6 mg/dL
Microalbumin Ur, POC: 19.2 mg/L

## 2022-03-04 MED ORDER — ATORVASTATIN CALCIUM 20 MG PO TABS: 20.0000 mg | ORAL_TABLET | Freq: Every day | ORAL | 3 refills | Status: DC

## 2022-03-04 MED ORDER — LISINOPRIL 5 MG PO TABS: 5.0000 mg | ORAL_TABLET | Freq: Every day | ORAL | 3 refills | Status: DC

## 2022-03-04 NOTE — Patient Instructions (Addendum)
Return here in 4 months  time h okay now see what else is going on immunizations ealth Maintenance, Male Adopting a healthy lifestyle and getting preventive care are important in promoting health and wellness. Ask your health care provider about: The right schedule for you to have regular tests and exams. Things you can do on your own to prevent diseases and keep yourself healthy. What should I know about diet, weight, and exercise? Eat a healthy diet  Eat a diet that includes plenty of vegetables, fruits, low-fat dairy products, and lean protein. Do not eat a lot of foods that are high in solid fats, added sugars, or sodium. Maintain a healthy weight Body mass index (BMI) is a measurement that can be used to identify possible weight problems. It estimates body fat based on height and weight. Your health care provider can help determine your BMI and help you achieve or maintain a healthy weight. Get regular exercise Get regular exercise. This is one of the most important things you can do for your health. Most adults should: Exercise for at least 150 minutes each week. The exercise should increase your heart rate and make you sweat (moderate-intensity exercise). Do strengthening exercises at least twice a week. This is in addition to the moderate-intensity exercise. Spend less time sitting. Even light physical activity can be beneficial. Watch cholesterol and blood lipids Have your blood tested for lipids and cholesterol at 78 years of age, then have this test every 5 years. You may need to have your cholesterol levels checked more often if: Your lipid or cholesterol levels are high. You are older than 78 years of age. You are at high risk for heart disease. What should I know about cancer screening? Many types of cancers can be detected early and may often be prevented. Depending on your health history and family history, you may need to have cancer screening at various ages. This may  include screening for: Colorectal cancer. Prostate cancer. Skin cancer. Lung cancer. What should I know about heart disease, diabetes, and high blood pressure? Blood pressure and heart disease High blood pressure causes heart disease and increases the risk of stroke. This is more likely to develop in people who have high blood pressure readings or are overweight. Talk with your health care provider about your target blood pressure readings. Have your blood pressure checked: Every 3-5 years if you are 76-57 years of age. Every year if you are 15 years old or older. If you are between the ages of 6 and 67 and are a current or former smoker, ask your health care provider if you should have a one-time screening for abdominal aortic aneurysm (AAA). Diabetes Have regular diabetes screenings. This checks your fasting blood sugar level. Have the screening done: Once every three years after age 59 if you are at a normal weight and have a low risk for diabetes. More often and at a younger age if you are overweight or have a high risk for diabetes. What should I know about preventing infection? Hepatitis B If you have a higher risk for hepatitis B, you should be screened for this virus. Talk with your health care provider to find out if you are at risk for hepatitis B infection. Hepatitis C Blood testing is recommended for: Everyone born from [birth year] through 1967. Anyone with known risk factors for hepatitis C. Sexually transmitted infections (STIs) You should be screened each year for STIs, including gonorrhea and chlamydia, if: You are sexually active and  are younger than 78 years of age. You are older than 78 years of age and your health care provider tells you that you are at risk for this type of infection. Your sexual activity has changed since you were last screened, and you are at increased risk for chlamydia or gonorrhea. Ask your health care provider if you are at risk. Ask your health care  provider about whether you are at high risk for HIV. Your health care provider may recommend a prescription medicine to help prevent HIV infection. If you choose to take medicine to prevent HIV, you should first get tested for HIV. You should then be tested every 3 months for as long as you are taking the medicine. Follow these instructions at home: Alcohol use Do not drink alcohol if your health care provider tells you not to drink. If you drink alcohol: Limit how much you have to 0-2 drinks a day. Know how much alcohol is in your drink. In the [place] one drink equals one 12 oz bottle of beer (355 mL), one 5 oz glass of wine (148 mL), or one 1 oz glass of hard liquor (44 mL). Lifestyle Do not use any products that contain nicotine or tobacco. These products include cigarettes, chewing tobacco, and vaping devices, such as e-cigarettes. If you need help quitting, ask your health care provider. Do not use street drugs. Do not share needles. Ask your health care provider for help if you need support or information about quitting drugs. General instructions Schedule regular health, dental, and eye exams. Stay current with your vaccines. Tell your health care provider if: You often feel depressed. You have ever been abused or do not feel safe at home. Summary Adopting a healthy lifestyle and getting preventive care are important in promoting health and wellness. Follow your health care provider's instructions about healthy diet, exercising, and getting tested or screened for diseases. Follow your health care provider's instructions on monitoring your cholesterol and blood pressure. This information is not intended to replace advice given to you by your health care provider. Make sure you discuss any questions you have with your health care provider. Document Revised: 11/11/2020 Document Reviewed: 11/11/2020 Elsevier Patient Education  [place].

## 2022-03-04 NOTE — Progress Notes (Signed)
Complete Med check plus exam  Patient: Peter Daniels   DOB: [birth year]   76 y.o. Male  MRN: [id number]  Subjective:    Chief Complaint  Patient presents with   med check plus     Fasting      Peter Daniels is a 76 y.o. male who presents today for a complete med check plus exam. He reports consuming a general diet.  Staying active QD.  He generally feels well. He reports sleeping well. He does not have additional problems to discuss today.  And having no difficulty with that.  Also taking low-dose lisinopril.  He has a history of colonic polyps but is not interesting in having another colonoscopy.  Also has a history of atherosclerosis of the aorta as mentioned above he is on atorvastatin.  He does have a history of actinic keratosis.  He also is now noted purpuric lesions mainly on his left forearm.  He does have a history of eczema.  He admits to dietary indiscretion over the last several years.  Most recent fall risk assessment:    11/28/2021    9:16 AM  Fall Risk   Falls in the past year? 1  Comment pulled by dog  Number falls in past yr: 1  Injury with Fall? 0  Risk for fall due to : Medication side effect  Follow up Falls evaluation completed;Education provided;Falls prevention discussed     Most recent depression screenings:    11/28/2021    9:17 AM 11/18/2020    9:36 AM  PHQ 2/9 Scores  PHQ - 2 Score 0 0      Patient Active Problem List   Diagnosis Date Noted   Senile purpura ([place]) 03/04/2022   Caries involving multiple surfaces of tooth 03/04/2022   Actinic keratosis 11/18/2020   Atherosclerosis of aorta ([place]) 11/17/2018   Hx of colonic polyps 01/19/2018   Hypertension associated with diabetes ([place]) 01/23/2016   Eczema 01/08/2015   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[place]) 14/97/0263   Hyperlipidemia associated with type 2 diabetes mellitus ([place]) 03/01/2014   Past Medical History:  Diagnosis Date   Cataract    Diabetes mellitus without  complication (HCC)    Hx of colonic polyps 01/19/2018   Hyperlipidemia    Hypertension    Past Surgical History:  Procedure Laterality Date   none     Social History   Tobacco Use   Smoking status: Former    Years: 25.00    Types: Cigarettes    Quit date: 01/08/1995    Years since quitting: 27.1   Smokeless tobacco: Never  Vaping Use   Vaping Use: Never used  Substance Use Topics   Alcohol use: Yes    Alcohol/week: 20.0 standard drinks of alcohol    Types: 20 Shots of liquor per week    Comment: 4 drinks/day   Drug use: No   Family History  Problem Relation Age of Onset   Asthma Other    Cancer Other    Hypertension Other    Colon cancer Neg Hx    Esophageal cancer Neg Hx    Liver cancer Neg Hx    Pancreatic cancer Neg Hx    Rectal cancer Neg Hx    Stomach cancer Neg Hx    Allergies  Allergen Reactions   Sulfa Antibiotics Rash    Wheal-and-flare reaction noted      Patient Care Team: Denita Lung, MD as PCP - General (Family Medicine)   Outpatient Medications  Prior to Visit  Medication Sig Note   Ascorbic Acid (VITAMIN C) 1000 MG tablet Take 1,000 mg by mouth daily.    Cholecalciferol (VITAMIN D) 50 MCG (2000 UT) tablet Take 2,000 Units by mouth daily.    diphenhydrAMINE HCl, Sleep, (ZZZQUIL PO) Take by mouth. 03/04/2022: Prn last dose last hs   FOLIC ACID PO Take 1 tablet by mouth.    Multiple Vitamins-Minerals (MULTIVITAMIN ADULTS 50+) TABS Take by mouth.    [DISCONTINUED] atorvastatin (LIPITOR) 20 MG tablet TAKE ONE TABLET BY MOUTH ONCE DAILY    [DISCONTINUED] lisinopril (ZESTRIL) 5 MG tablet TAKE ONE TABLET BY MOUTH ONE TIME DAILY    amoxicillin-clavulanate (AUGMENTIN) 875-125 MG tablet Take 1 tablet by mouth 2 (two) times daily. (Patient not taking: Reported on 02/19/2020)    mupirocin ointment (BACTROBAN) 2 % Apply topically 2 (two) times daily. (Patient not taking: Reported on 11/18/2020)    ondansetron (ZOFRAN ODT) 4 MG disintegrating tablet Take 1  tablet (4 mg total) by mouth every 8 (eight) hours as needed for nausea or vomiting. (Patient not taking: Reported on 02/19/2020)    predniSONE (STERAPRED UNI-PAK 21 TAB) 10 MG (21) TBPK tablet Take as per manufacturer's recommendations (Patient not taking: Reported on 02/19/2020)    No facility-administered medications prior to visit.    Review of Systems  All other systems reviewed and are negative.         Objective:     BP (!) 150/80   Pulse 79   Temp (!) 97.5 F (36.4 C)   Ht 5' 8.75" (1.746 m)   Wt 220 lb (99.8 kg)   SpO2 98%   BMI 32.73 kg/m  BP Readings from Last 3 Encounters:  03/04/22 (!) 150/80  11/18/20 134/86  02/19/20 122/78   Wt Readings from Last 3 Encounters:  03/04/22 220 lb (99.8 kg)  11/28/21 220 lb (99.8 kg)  11/18/20 221 lb (100.2 kg)      Physical Exam  Alert and in no distress.  Oral exam shows multiple caries .tympanic membranes and canals are normal. Pharyngeal area is normal. Neck is supple without adenopathy or thyromegaly. Cardiac exam shows a regular sinus rhythm without murmurs or gallops. Lungs are clear to auscultation. Hemoglobin A1c is 6.9.  Exam of the left forearm does show multiple purpuric lesions.  He also has red scaly lesions present on his forehead.     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[place]) - Plan: CBC with Differential/Platelet, Comprehensive metabolic panel, POCT glycosylated hemoglobin (Hb A1C), POCT UA - Microalbumin, Lipid panel  Atherosclerosis of aorta (HCC) - Plan: Lipid panel  Hypertension associated with diabetes ([place]) - Plan: CBC with Differential/Platelet, Comprehensive metabolic panel, lisinopril (ZESTRIL) 5 MG tablet  Hyperlipidemia associated with type 2 diabetes mellitus ([place]) - Plan: Lipid panel, atorvastatin (LIPITOR) 20 MG tablet  Eczema, unspecified type  Hx of colonic polyps  Senile purpura ([place])  Actinic keratosis - Plan: Ambulatory referral to Dermatology  Caries  involving multiple surfaces of tooth   Immunization History  Administered Date(s) Administered   Influenza Split 07/09/2011   Influenza, High Dose Seasonal PF 05/01/2014, 05/13/2015   PFIZER(Purple Top)SARS-COV-2 Vaccination 07/21/2019, 08/11/2019, 05/03/2020   Pneumococcal Conjugate-13 05/01/2014   Pneumococcal Polysaccharide-23 03/10/2013   Tdap 09/05/2009, 01/31/2020    Health Maintenance  Topic Date Due   Zoster Vaccines- Shingrix (1 of 2) Never done   OPHTHALMOLOGY EXAM  03/08/2020   COVID-19 Vaccine (4 - Pfizer risk series) 06/28/2020  COLONOSCOPY (Pts 45-45yr Insurance coverage will need to be confirmed)  01/13/2021   HEMOGLOBIN A1C  05/21/2021   FOOT EXAM  11/18/2021   INFLUENZA VACCINE  02/03/2022   TETANUS/TDAP  01/30/2030   Pneumonia Vaccine 76 Years old  Completed   Hepatitis C Screening  Completed   HPV VACCINES  Aged Out    Discussed health benefits of physical activity, and encouraged him to engage in regular exercise appropriate for his age and condition.  Discussed the benefit of doing this to help with his diabetes.  He is to return here in 4 months for recheck on this.  Continue his present medication.  Explained that the purpuric lesions are common at this stage in his life.  Recommend he see a dentist to get his teeth taken care of.  Refer to dermatology for his actinic keratoses.  Problem List Items Addressed This Visit     Actinic keratosis   Relevant Orders   Ambulatory referral to Dermatology   Atherosclerosis of aorta (HCC)   Relevant Medications   lisinopril (ZESTRIL) 5 MG tablet   atorvastatin (LIPITOR) 20 MG tablet   Other Relevant Orders   Lipid panel   Caries involving multiple surfaces of tooth   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HCC) - Primary   Relevant Medications   lisinopril (ZESTRIL) 5 MG tablet   atorvastatin (LIPITOR) 20 MG tablet   Other Relevant Orders   CBC with Differential/Platelet   Comprehensive metabolic panel    POCT glycosylated hemoglobin (Hb A1C)   POCT UA - Microalbumin   Lipid panel   Eczema   Hx of colonic polyps   Hyperlipidemia associated with type 2 diabetes mellitus (HCC)   Relevant Medications   lisinopril (ZESTRIL) 5 MG tablet   atorvastatin (LIPITOR) 20 MG tablet   Other Relevant Orders   Lipid panel   Hypertension associated with diabetes ([place]   Relevant Medications   lisinopril (ZESTRIL) 5 MG tablet   atorvastatin (LIPITOR) 20 MG tablet   Other Relevant Orders   CBC with Differential/Platelet   Comprehensive metabolic panel   Senile purpura (HCC)   Relevant Medications   lisinopril (ZESTRIL) 5 MG tablet   atorvastatin (LIPITOR) 20 MG tablet   Return in about 1 year (around 03/05/2023) for med checl plus. Return here in 4 months for recheck on his diabetes status.    JJill Alexanders MD

## 2022-03-05 LAB — COMPREHENSIVE METABOLIC PANEL
ALT: 20 IU/L (ref 0–44)
AST: 20 IU/L (ref 0–40)
Albumin/Globulin Ratio: 1.8 (ref 1.2–2.2)
Albumin: 4.7 g/dL (ref 3.8–4.8)
Alkaline Phosphatase: 58 IU/L (ref 44–121)
BUN/Creatinine Ratio: 14 (ref 10–24)
BUN: 13 mg/dL (ref 8–27)
Bilirubin Total: 1.8 mg/dL — ABNORMAL HIGH (ref 0.0–1.2)
CO2: 23 mmol/L (ref 20–29)
Calcium: 10 mg/dL (ref 8.6–10.2)
Chloride: 100 mmol/L (ref 96–106)
Creatinine, Ser: 0.91 mg/dL (ref 0.76–1.27)
Globulin, Total: 2.6 g/dL (ref 1.5–4.5)
Glucose: 131 mg/dL — ABNORMAL HIGH (ref 70–99)
Potassium: 4.9 mmol/L (ref 3.5–5.2)
Sodium: 137 mmol/L (ref 134–144)
Total Protein: 7.3 g/dL (ref 6.0–8.5)
eGFR: 87 mL/min/{1.73_m2} (ref >59)

## 2022-03-05 LAB — LIPID PANEL
Chol/HDL Ratio: 2.9 ratio (ref 0.0–5.0)
Cholesterol, Total: 193 mg/dL (ref 100–199)
HDL: 66 mg/dL (ref >39)
LDL Chol Calc (NIH): 109 mg/dL — ABNORMAL HIGH (ref 0–99)
Triglycerides: 104 mg/dL (ref 0–149)
VLDL Cholesterol Cal: 18 mg/dL (ref 5–40)

## 2022-03-05 LAB — CBC WITH DIFFERENTIAL/PLATELET
Basophils Absolute: 0 10*3/uL (ref 0.0–0.2)
Basos: 1 %
EOS (ABSOLUTE): 0.1 10*3/uL (ref 0.0–0.4)
Eos: 2 %
Hematocrit: 42.4 % (ref 37.5–51.0)
Hemoglobin: 14.7 g/dL (ref 13.0–17.7)
Immature Grans (Abs): 0 10*3/uL (ref 0.0–0.1)
Immature Granulocytes: 0 %
Lymphocytes Absolute: 1.9 10*3/uL (ref 0.7–3.1)
Lymphs: 23 %
MCH: 31.1 pg (ref 26.6–33.0)
MCHC: 34.7 g/dL (ref 31.5–35.7)
MCV: 90 fL (ref 79–97)
Monocytes Absolute: 0.6 10*3/uL (ref 0.1–0.9)
Monocytes: 7 %
Neutrophils Absolute: 5.4 10*3/uL (ref 1.4–7.0)
Neutrophils: 67 %
Platelets: 226 10*3/uL (ref 150–450)
RBC: 4.72 x10E6/uL (ref 4.14–5.80)
RDW: 11.8 % (ref 11.6–15.4)
WBC: 8.1 10*3/uL (ref 3.4–10.8)

## 2022-03-05 MED ORDER — ATORVASTATIN CALCIUM 40 MG PO TABS: 40.0000 mg | ORAL_TABLET | Freq: Every day | ORAL | 3 refills | Status: DC

## 2022-03-05 NOTE — Addendum Note (Signed)
Addended by: Denita Lung on: 03/05/2022 12:48 PM   Modules accepted: Orders

## 2022-03-11 ENCOUNTER — Encounter: Payer: Self-pay | Admitting: Internal Medicine

## 2022-04-07 DIAGNOSIS — X32XXXA Exposure to sunlight, initial encounter: Secondary | ICD-10-CM | POA: Diagnosis not present

## 2022-04-07 DIAGNOSIS — L218 Other seborrheic dermatitis: Secondary | ICD-10-CM | POA: Diagnosis not present

## 2022-04-07 DIAGNOSIS — L570 Actinic keratosis: Secondary | ICD-10-CM | POA: Diagnosis not present

## 2022-04-07 DIAGNOSIS — C44311 Basal cell carcinoma of skin of nose: Secondary | ICD-10-CM | POA: Diagnosis not present

## 2022-04-14 ENCOUNTER — Encounter: Payer: Self-pay | Admitting: Internal Medicine

## 2022-04-27 ENCOUNTER — Encounter: Payer: Self-pay | Admitting: Internal Medicine

## 2022-04-28 ENCOUNTER — Other Ambulatory Visit (INDEPENDENT_AMBULATORY_CARE_PROVIDER_SITE_OTHER): Payer: Medicare Other

## 2022-04-28 DIAGNOSIS — Z23 Encounter for immunization: Secondary | ICD-10-CM | POA: Diagnosis not present

## 2022-05-19 DIAGNOSIS — X32XXXD Exposure to sunlight, subsequent encounter: Secondary | ICD-10-CM | POA: Diagnosis not present

## 2022-05-19 DIAGNOSIS — L570 Actinic keratosis: Secondary | ICD-10-CM | POA: Diagnosis not present

## 2022-05-19 DIAGNOSIS — C44311 Basal cell carcinoma of skin of nose: Secondary | ICD-10-CM | POA: Diagnosis not present

## 2022-07-03 ENCOUNTER — Encounter: Payer: Self-pay | Admitting: Family Medicine

## 2022-07-03 ENCOUNTER — Ambulatory Visit (INDEPENDENT_AMBULATORY_CARE_PROVIDER_SITE_OTHER): Payer: Medicare Other | Admitting: Family Medicine

## 2022-07-03 VITALS — BP 134/78 | HR 91 | Wt 201.6 lb

## 2022-07-03 DIAGNOSIS — E118 Type 2 diabetes mellitus with unspecified complications: Secondary | ICD-10-CM | POA: Diagnosis not present

## 2022-07-03 DIAGNOSIS — E669 Obesity, unspecified: Secondary | ICD-10-CM | POA: Diagnosis not present

## 2022-07-03 LAB — POCT GLYCOSYLATED HEMOGLOBIN (HGB A1C): Hemoglobin A1C: 6.7 % — AB (ref 4.0–5.6)

## 2022-07-03 NOTE — Progress Notes (Signed)
   Subjective:    Patient ID: Peter Daniels, male    DOB: June 09, 1946, 76 y.o.   MRN: [id number]  HPI For recheck.  On his last visit, his A1c was 6.9.  Since that time he has made diet and exercise changes and has lost almost 20 pounds.  He is getting out and being more active volunteering and staying physically and mentally busy.  Presently he is not on any diabetes related medications.   Review of Systems     Objective:   Physical Exam Alert and in no distress otherwise not examined Hemoglobin A1c is 6.7.      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  With time we will probably need to add another medication.  Expressed the need to keep his A1c below 8.  4 months for recheck.

## 2022-11-03 ENCOUNTER — Encounter: Payer: Medicare Other | Admitting: Family Medicine

## 2022-11-03 DIAGNOSIS — I152 Hypertension secondary to endocrine disorders: Secondary | ICD-10-CM

## 2022-11-03 DIAGNOSIS — E1169 Type 2 diabetes mellitus with other specified complication: Secondary | ICD-10-CM

## 2022-11-03 DIAGNOSIS — E118 Type 2 diabetes mellitus with unspecified complications: Secondary | ICD-10-CM

## 2022-11-06 DIAGNOSIS — E119 Type 2 diabetes mellitus without complications: Secondary | ICD-10-CM | POA: Diagnosis not present

## 2022-11-06 DIAGNOSIS — H25813 Combined forms of age-related cataract, bilateral: Secondary | ICD-10-CM | POA: Diagnosis not present

## 2022-11-06 LAB — HM DIABETES EYE EXAM

## 2022-11-13 ENCOUNTER — Encounter: Payer: Self-pay | Admitting: Internal Medicine

## 2022-11-16 ENCOUNTER — Encounter: Payer: Self-pay | Admitting: Family Medicine

## 2022-11-16 ENCOUNTER — Ambulatory Visit (INDEPENDENT_AMBULATORY_CARE_PROVIDER_SITE_OTHER): Payer: Medicare Other | Admitting: Family Medicine

## 2022-11-16 VITALS — BP 124/70 | HR 79 | Temp 98.4°F | Resp 18 | Wt 213.8 lb

## 2022-11-16 DIAGNOSIS — E1169 Type 2 diabetes mellitus with other specified complication: Secondary | ICD-10-CM | POA: Diagnosis not present

## 2022-11-16 DIAGNOSIS — E1159 Type 2 diabetes mellitus with other circulatory complications: Secondary | ICD-10-CM | POA: Diagnosis not present

## 2022-11-16 DIAGNOSIS — E669 Obesity, unspecified: Secondary | ICD-10-CM | POA: Diagnosis not present

## 2022-11-16 DIAGNOSIS — E785 Hyperlipidemia, unspecified: Secondary | ICD-10-CM | POA: Diagnosis not present

## 2022-11-16 DIAGNOSIS — I152 Hypertension secondary to endocrine disorders: Secondary | ICD-10-CM | POA: Diagnosis not present

## 2022-11-16 DIAGNOSIS — E118 Type 2 diabetes mellitus with unspecified complications: Secondary | ICD-10-CM | POA: Diagnosis not present

## 2022-11-16 LAB — POCT GLYCOSYLATED HEMOGLOBIN (HGB A1C): Hemoglobin A1C: 6.9 % — AB (ref 4.0–5.6)

## 2022-11-16 NOTE — Progress Notes (Signed)
Subjective:    Patient ID: Peter Daniels, male    DOB: 02-26-1946, 77 y.o.   MRN: [id number]  Peter Daniels is a 77 y.o. male who presents for follow-up of Type 2 diabetes mellitus.  Home blood sugar records: fasting range: 135 Current symptoms/problems include paresthesia of the feet and have been stable. Daily foot checks: no   Any foot concerns: no How often blood sugars checked: occasional Exercise: The patient does not participate in regular exercise at present. Diet: regular diet.  He states he went back to his regular diet routine to see if it would make any difference.  He states he is getting tired of trying to maintain a new diabetic diet.  He continues to have at least 3 alcoholic beverages every night.  The following portions of the patient's history were reviewed and updated as appropriate: allergies, current medications, past medical history, past social history and problem list.  ROS as in subjective above.     Objective:    Physical Exam Alert and in no distress otherwise not examined. Hemoglobin A1c is 7.1 Blood pressure 124/70, pulse 79, temperature 98.4 F (36.9 C), temperature source Oral, resp. rate 18, weight 213 lb 12.8 oz (97 kg), SpO2 99 %.  Lab Review    Latest Ref Rng & Units 07/03/2022   11:59 AM 03/04/2022    3:46 PM 03/04/2022    3:28 PM 11/18/2020   10:37 AM 11/18/2020   10:03 AM  Diabetic Labs  HbA1c 4.0 - 5.6 % 6.7  6.9    6.0   Microalbumin mg/L  19.2      Micro/Creat Ratio   12.1      Chol 100 - 199 mg/dL   409  811    HDL >91 mg/dL   66  79    Calc LDL 0 - 99 mg/dL   478  89    Triglycerides 0 - 149 mg/dL   295  66    Creatinine 0.76 - 1.27 mg/dL   6.21  3.08        6/57/8469   11:40 AM 07/03/2022   11:34 AM 03/04/2022    3:47 PM 03/04/2022    2:42 PM 11/28/2021    9:11 AM  BP/Weight  Systolic BP 124 134 140 150   Diastolic BP 70 78 72 80   Wt. (Lbs) 213.8 201.6  220 220  BMI 31.8 kg/m2 29.99 kg/m2  32.73 kg/m2 34.46 kg/m2       Latest Ref Rng & Units 11/06/2022   12:00 AM 11/18/2020    9:30 AM  Foot/eye exam completion dates  Eye Exam No Retinopathy No Retinopathy       Foot Form Completion   Done     This result is from an external source.    Lex  reports that he quit smoking about 27 years ago. His smoking use included cigarettes. He has never used smokeless tobacco. He reports current alcohol use of about 20.0 standard drinks of alcohol per week. He reports that he does not use drugs.     Assessment & Plan:    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HCC) - Plan: POCT glycosylated hemoglobin (Hb A1C)  Obesity (BMI 30.0-34.9)  Hypertension associated with diabetes (HCC)  Hyperlipidemia associated with type 2 diabetes mellitus (HCC)  He seems to be holding his own.  I see no reason to push for any further intervention especially since his A1c is in a good range.  Recheck here in about 6  months.

## 2023-03-09 ENCOUNTER — Ambulatory Visit (INDEPENDENT_AMBULATORY_CARE_PROVIDER_SITE_OTHER): Payer: Medicare Other

## 2023-03-09 DIAGNOSIS — Z0000 Encounter for general adult medical examination without abnormal findings: Secondary | ICD-10-CM

## 2023-03-09 NOTE — Progress Notes (Signed)
Subjective:   Peter Daniels is a 77 y.o. male who presents for Medicare Annual/Subsequent preventive examination.  Visit Complete: Virtual  I connected with  Peter Daniels on 03/09/23 by a audio enabled telemedicine application and verified that I am speaking with the correct person using two identifiers.  Patient Location: Home  Provider Location: Office/Clinic  I discussed the limitations of evaluation and management by telemedicine. The patient expressed understanding and agreed to proceed.  Vital Signs: Unable to obtain new vitals due to this being a telehealth visit.  Review of Systems     Cardiac Risk Factors include: advanced age (>55men, >29 women);diabetes mellitus;dyslipidemia;hypertension;male gender     Objective:    Today's Vitals   There is no height or weight on file to calculate BMI.     03/09/2023    8:16 AM 11/28/2021    9:16 AM 11/18/2020    9:40 AM 11/17/2019   10:26 AM 11/17/2018    9:56 AM 01/23/2016   10:34 AM 02/27/2014   12:42 PM  Advanced Directives  Does Patient Have a Medical Advance Directive? No No No No No No No  Would patient like information on creating a medical advance directive?   Yes (ED - Information included in AVS) Yes (MAU/Ambulatory/Procedural Areas - Information given) Yes (MAU/Ambulatory/Procedural Areas - Information given) Yes - Educational materials given No - patient declined information    Current Medications (verified) Outpatient Encounter Medications as of 03/09/2023  Medication Sig   Ascorbic Acid (VITAMIN C) 1000 MG tablet Take 1,000 mg by mouth daily.   atorvastatin (LIPITOR) 40 MG tablet Take 1 tablet (40 mg total) by mouth daily.   diphenhydrAMINE HCl, Sleep, (ZZZQUIL PO) Take by mouth.   lisinopril (ZESTRIL) 5 MG tablet Take 1 tablet (5 mg total) by mouth daily.   Multiple Vitamins-Minerals (MULTIVITAMIN ADULTS 50+) TABS Take by mouth.   Cholecalciferol (VITAMIN D) 50 MCG (2000 UT) tablet Take 2,000 Units by  mouth daily. (Patient not taking: Reported on 11/16/2022)   No facility-administered encounter medications on file as of 03/09/2023.    Allergies (verified) Sulfa antibiotics   History: Past Medical History:  Diagnosis Date   Cataract    Diabetes mellitus without complication (HCC)    Hx of colonic polyps 01/19/2018   Hyperlipidemia    Hypertension    Past Surgical History:  Procedure Laterality Date   none     Family History  Problem Relation Age of Onset   Asthma Other    Cancer Other    Hypertension Other    Colon cancer Neg Hx    Esophageal cancer Neg Hx    Liver cancer Neg Hx    Pancreatic cancer Neg Hx    Rectal cancer Neg Hx    Stomach cancer Neg Hx    Social History   Socioeconomic History   Marital status: Single    Spouse name: Not on file   Number of children: Not on file   Years of education: Not on file   Highest education level: Not on file  Occupational History   Not on file  Tobacco Use   Smoking status: Former    Current packs/day: 0.00    Types: Cigarettes    Start date: 01/07/1970    Quit date: 01/08/1995    Years since quitting: 28.1   Smokeless tobacco: Never  Vaping Use   Vaping status: Never Used  Substance and Sexual Activity   Alcohol use: Yes    Alcohol/week: 20.0 standard drinks  of alcohol    Types: 20 Shots of liquor per week    Comment: 3 drinks/day   Drug use: No   Sexual activity: Not Currently  Other Topics Concern   Not on file  Social History Narrative   Not on file   Social Determinants of Health   Financial Resource Strain: Low Risk  (03/09/2023)   Overall Financial Resource Strain (CARDIA)    Difficulty of Paying Living Expenses: Not hard at all  Food Insecurity: No Food Insecurity (03/09/2023)   Hunger Vital Sign    Worried About Running Out of Food in the Last Year: Never true    Ran Out of Food in the Last Year: Never true  Transportation Needs: No Transportation Needs (03/09/2023)   PRAPARE - Doctor, general practice (Medical): No    Lack of Transportation (Non-Medical): No  Physical Activity: Inactive (03/09/2023)   Exercise Vital Sign    Days of Exercise per Week: 0 days    Minutes of Exercise per Session: 0 min  Stress: No Stress Concern Present (03/09/2023)   [employer] of Occupational Health - Occupational Stress Questionnaire    Feeling of Stress : Only a little  Social Connections: Socially Isolated (03/09/2023)   Social Connection and Isolation Panel [NHANES]    Frequency of Communication with Friends and Family: Never    Frequency of Social Gatherings with Friends and Family: Never    Attends Religious Services: Never    Diplomatic Services operational officer: No    Attends Engineer, structural: Never    Marital Status: Never married    Tobacco Counseling Counseling given: Not Answered   Clinical Intake:  Pre-visit preparation completed: Yes  Pain : No/denies pain     Nutritional Risks: None Diabetes: Yes CBG done?: No Did pt. bring in CBG monitor from home?: No  How often do you need to have someone help you when you read instructions, pamphlets, or other written materials from your doctor or pharmacy?: 1 - Never  Interpreter Needed?: No  Information entered by :: NAllen LPN   Activities of Daily Living    03/09/2023    8:11 AM  In your present state of health, do you have any difficulty performing the following activities:  Hearing? 0  Comment little bit  Vision? 1  Comment has cataracts  Difficulty concentrating or making decisions? 0  Comment short term memory a little  Walking or climbing stairs? 0  Dressing or bathing? 0  Doing errands, shopping? 0  Preparing Food and eating ? N  Using the Toilet? N  In the past six months, have you accidently leaked urine? N  Do you have problems with loss of bowel control? N  Managing your Medications? N  Managing your Finances? N  Housekeeping or managing your Housekeeping? N    Patient  Care Team: Ronnald Nian, MD as PCP - General (Family Medicine)  Indicate any recent Medical Services you may have received from other than [facility] providers in the past year (date may be approximate).     Assessment:   This is a routine wellness examination for Peter Daniels.  Hearing/Vision screen Hearing Screening - Comments:: Denies hearing issues Vision Screening - Comments:: Regular eye exams, in [place]  Dietary issues and exercise activities discussed:     Goals Addressed             This Visit's Progress    Patient Stated  03/09/2023, denies goals       Depression Screen    03/09/2023    8:17 AM 11/28/2021    9:17 AM 11/18/2020    9:36 AM 11/17/2019    9:59 AM 11/17/2018    9:37 AM 11/15/2017   10:38 AM 01/23/2016   10:33 AM  PHQ 2/9 Scores  PHQ - 2 Score 0 0 0 0 0 1 0  PHQ- 9 Score 0          Fall Risk    03/09/2023    8:16 AM 11/16/2022   11:37 AM 11/28/2021    9:16 AM 11/18/2020    9:35 AM 01/31/2020    9:09 AM  Fall Risk   Falls in the past year? 1 1 1  0 0  Comment stepped on something  pulled by dog    Number falls in past yr: 0 0 1 0   Injury with Fall? 0 0 0 0   Risk for fall due to : Medication side effect No Fall Risks Medication side effect No Fall Risks   Follow up Falls prevention discussed;Falls evaluation completed Falls evaluation completed Falls evaluation completed;Education provided;Falls prevention discussed Falls evaluation completed     MEDICARE RISK AT HOME: Medicare Risk at Home Any stairs in or around the home?: Yes If so, are there any without handrails?: No Home free of loose throw rugs in walkways, pet beds, electrical cords, etc?: Yes Adequate lighting in your home to reduce risk of falls?: Yes Life alert?: No Use of a cane, walker or w/c?: No Grab bars in the bathroom?: Yes Shower chair or bench in shower?: No Elevated toilet seat or a handicapped toilet?: Yes  TIMED UP AND GO:  Was the test performed?  No    Cognitive  Function:        03/09/2023    8:18 AM 11/28/2021    9:18 AM  6CIT Screen  What Year? 0 points 0 points  What month? 0 points 0 points  What time? 0 points 0 points  Count back from 20 0 points 0 points  Months in reverse 0 points 0 points  Repeat phrase 0 points 4 points  Total Score 0 points 4 points    Immunizations Immunization History  Administered Date(s) Administered   COVID-19, mRNA, vaccine(Comirnaty)12 years and older 04/28/2022   Fluad Quad(high Dose 65+) 04/28/2022   Influenza Split 07/09/2011   Influenza, High Dose Seasonal PF 05/01/2014, 05/13/2015   PFIZER(Purple Top)SARS-COV-2 Vaccination 07/21/2019, 08/11/2019, 05/03/2020   Pneumococcal Conjugate-13 05/01/2014   Pneumococcal Polysaccharide-23 03/10/2013   Tdap 09/05/2009, 01/31/2020    TDAP status: Up to date  Flu Vaccine status: Due, Education has been provided regarding the importance of this vaccine. Advised may receive this vaccine at local pharmacy or Health Dept. Aware to provide a copy of the vaccination record if obtained from local pharmacy or Health Dept. Verbalized acceptance and understanding.  Pneumococcal vaccine status: Up to date  Covid-19 vaccine status: Completed vaccines  Qualifies for Shingles Vaccine? Yes   Zostavax completed No   Shingrix Completed?: No.    Education has been provided regarding the importance of this vaccine. Patient has been advised to call insurance company to determine out of pocket expense if they have not yet received this vaccine. Advised may also receive vaccine at local pharmacy or Health Dept. Verbalized acceptance and understanding.  Screening Tests Health Maintenance  Topic Date Due   Zoster Vaccines- Shingrix (1 of 2) Never done   Colonoscopy  01/13/2021   FOOT EXAM  11/18/2021   INFLUENZA VACCINE  02/04/2023   Diabetic kidney evaluation - eGFR measurement  03/05/2023   Diabetic kidney evaluation - Urine ACR  03/05/2023   COVID-19 Vaccine (5 - 2023-24  season) 03/07/2023   HEMOGLOBIN A1C  05/19/2023   OPHTHALMOLOGY EXAM  11/06/2023   Medicare Annual Wellness (AWV)  03/08/2024   DTaP/Tdap/Td (3 - Td or Tdap) 01/30/2030   Pneumonia Vaccine 75+ Years old  Completed   Hepatitis C Screening  Completed   HPV VACCINES  Aged Out    Health Maintenance  Health Maintenance Due  Topic Date Due   Zoster Vaccines- Shingrix (1 of 2) Never done   Colonoscopy  01/13/2021   FOOT EXAM  11/18/2021   INFLUENZA VACCINE  02/04/2023   Diabetic kidney evaluation - eGFR measurement  03/05/2023   Diabetic kidney evaluation - Urine ACR  03/05/2023   COVID-19 Vaccine (5 - 2023-24 season) 03/07/2023    Colorectal cancer screening: No longer required.   Lung Cancer Screening: (Low Dose CT Chest recommended if Age 34-80 years, 20 pack-year currently smoking OR have quit w/in 15years.) does not qualify.   Lung Cancer Screening Referral: no  Additional Screening:  Hepatitis C Screening: does qualify; Completed 11/17/2018  Vision Screening: Recommended annual ophthalmology exams for early detection of glaucoma and other disorders of the eye. Is the patient up to date with their annual eye exam?  Yes  Who is the provider or what is the name of the office in which the patient attends annual eye exams? In [facility] If pt is not established with a provider, would they like to be referred to a provider to establish care? No .   Dental Screening: Recommended annual dental exams for proper oral hygiene  Diabetic Foot Exam: Diabetic Foot Exam: Overdue, Pt has been advised about the importance in completing this exam. Pt is scheduled for diabetic foot exam on next appointment.  Community Resource Referral / Chronic Care Management: CRR required this visit?  No   CCM required this visit?  No     Plan:     I have personally reviewed and noted the following in the patient's chart:   Medical and social history Use of alcohol, tobacco or illicit drugs   Current medications and supplements including opioid prescriptions. Patient is not currently taking opioid prescriptions. Functional ability and status Nutritional status Physical activity Advanced directives List of other physicians Hospitalizations, surgeries, and ER visits in previous 12 months Vitals Screenings to include cognitive, depression, and falls Referrals and appointments  In addition, I have reviewed and discussed with patient certain preventive protocols, quality metrics, and best practice recommendations. A written personalized care plan for preventive services as well as general preventive health recommendations were provided to patient.     Barb Merino, LPN   07/11/1094   After Visit Summary: (MyChart) Due to this being a telephonic visit, the after visit summary with patients personalized plan was offered to patient via MyChart   Nurse Notes: none

## 2023-03-09 NOTE — Patient Instructions (Addendum)
Mr. Chesbro , Thank you for taking time to come for your Medicare Wellness Visit. I appreciate your ongoing commitment to your health goals. Please review the following plan we discussed and let me know if I can assist you in the future.   Referrals/Orders/Follow-Ups/Clinician Recommendations: none  This is a list of the screening recommended for you and due dates:  Health Maintenance  Topic Date Due   Zoster (Shingles) Vaccine (1 of 2) Never done   Colon Cancer Screening  01/13/2021   Complete foot exam   11/18/2021   Flu Shot  02/04/2023   Yearly kidney function blood test for diabetes  03/05/2023   Yearly kidney health urinalysis for diabetes  03/05/2023   COVID-19 Vaccine (5 - 2023-24 season) 03/07/2023   Hemoglobin A1C  05/19/2023   Eye exam for diabetics  11/06/2023   Medicare Annual Wellness Visit  03/08/2024   DTaP/Tdap/Td vaccine (3 - Td or Tdap) 01/30/2030   Pneumonia Vaccine  Completed   Hepatitis C Screening  Completed   HPV Vaccine  Aged Out    Advanced directives: (ACP Link)Information on Advanced Care Planning can be found at [facility] Advance Health Care Directives Advance Health Care Directives ([email or web address]   Next Medicare Annual Wellness Visit scheduled for next year: Yes  insert Preventive Care attachment Insert FALL PREVENTION attachment if needed

## 2023-04-30 DIAGNOSIS — H18413 Arcus senilis, bilateral: Secondary | ICD-10-CM | POA: Diagnosis not present

## 2023-04-30 DIAGNOSIS — H2511 Age-related nuclear cataract, right eye: Secondary | ICD-10-CM | POA: Diagnosis not present

## 2023-04-30 DIAGNOSIS — H25043 Posterior subcapsular polar age-related cataract, bilateral: Secondary | ICD-10-CM | POA: Diagnosis not present

## 2023-04-30 DIAGNOSIS — H2513 Age-related nuclear cataract, bilateral: Secondary | ICD-10-CM | POA: Diagnosis not present

## 2023-04-30 DIAGNOSIS — H25013 Cortical age-related cataract, bilateral: Secondary | ICD-10-CM | POA: Diagnosis not present

## 2023-06-09 DIAGNOSIS — H2511 Age-related nuclear cataract, right eye: Secondary | ICD-10-CM | POA: Diagnosis not present

## 2023-06-09 DIAGNOSIS — Z961 Presence of intraocular lens: Secondary | ICD-10-CM | POA: Diagnosis not present

## 2023-06-10 DIAGNOSIS — H2512 Age-related nuclear cataract, left eye: Secondary | ICD-10-CM | POA: Diagnosis not present

## 2023-06-21 ENCOUNTER — Other Ambulatory Visit: Payer: Self-pay | Admitting: Family Medicine

## 2023-06-21 DIAGNOSIS — I152 Hypertension secondary to endocrine disorders: Secondary | ICD-10-CM

## 2023-06-23 DIAGNOSIS — H2512 Age-related nuclear cataract, left eye: Secondary | ICD-10-CM | POA: Diagnosis not present

## 2023-06-23 DIAGNOSIS — Z961 Presence of intraocular lens: Secondary | ICD-10-CM | POA: Diagnosis not present

## 2023-06-28 ENCOUNTER — Encounter: Payer: Medicare Other | Admitting: Family Medicine

## 2023-06-28 DIAGNOSIS — Z860100 Personal history of colon polyps, unspecified: Secondary | ICD-10-CM

## 2023-06-28 DIAGNOSIS — L308 Other specified dermatitis: Secondary | ICD-10-CM

## 2023-06-28 DIAGNOSIS — E118 Type 2 diabetes mellitus with unspecified complications: Secondary | ICD-10-CM

## 2023-06-28 DIAGNOSIS — E1159 Type 2 diabetes mellitus with other circulatory complications: Secondary | ICD-10-CM

## 2023-06-28 DIAGNOSIS — E1169 Type 2 diabetes mellitus with other specified complication: Secondary | ICD-10-CM

## 2023-07-01 ENCOUNTER — Telehealth: Payer: Self-pay | Admitting: Family Medicine

## 2023-07-01 ENCOUNTER — Other Ambulatory Visit: Payer: Self-pay

## 2023-07-01 DIAGNOSIS — I152 Hypertension secondary to endocrine disorders: Secondary | ICD-10-CM

## 2023-07-01 MED ORDER — ATORVASTATIN CALCIUM 40 MG PO TABS: 40.0000 mg | ORAL_TABLET | Freq: Every day | ORAL | 1 refills | Status: DC

## 2023-07-01 MED ORDER — LISINOPRIL 5 MG PO TABS: 5.0000 mg | ORAL_TABLET | Freq: Every day | ORAL | 1 refills | Status: DC

## 2023-07-01 NOTE — Telephone Encounter (Signed)
Lisinopril 5 mg #90 last filled 02/08/23

## 2023-07-01 NOTE — Telephone Encounter (Signed)
Pt also needs  Atorvastatin 40 mg # 90 Last filled 02/08/23

## 2023-08-31 ENCOUNTER — Encounter: Payer: Self-pay | Admitting: Internal Medicine

## 2023-10-06 ENCOUNTER — Ambulatory Visit (INDEPENDENT_AMBULATORY_CARE_PROVIDER_SITE_OTHER): Payer: Medicare Other | Admitting: Family Medicine

## 2023-10-06 VITALS — BP 122/82 | HR 72 | Ht 69.0 in | Wt 221.2 lb

## 2023-10-06 DIAGNOSIS — E1169 Type 2 diabetes mellitus with other specified complication: Secondary | ICD-10-CM

## 2023-10-06 DIAGNOSIS — Z860100 Personal history of colon polyps, unspecified: Secondary | ICD-10-CM | POA: Diagnosis not present

## 2023-10-06 DIAGNOSIS — I700 Atherosclerosis of aorta: Secondary | ICD-10-CM

## 2023-10-06 DIAGNOSIS — E118 Type 2 diabetes mellitus with unspecified complications: Secondary | ICD-10-CM | POA: Diagnosis not present

## 2023-10-06 DIAGNOSIS — L570 Actinic keratosis: Secondary | ICD-10-CM | POA: Diagnosis not present

## 2023-10-06 DIAGNOSIS — I152 Hypertension secondary to endocrine disorders: Secondary | ICD-10-CM | POA: Diagnosis not present

## 2023-10-06 DIAGNOSIS — D692 Other nonthrombocytopenic purpura: Secondary | ICD-10-CM

## 2023-10-06 DIAGNOSIS — Z0000 Encounter for general adult medical examination without abnormal findings: Secondary | ICD-10-CM

## 2023-10-06 DIAGNOSIS — E1159 Type 2 diabetes mellitus with other circulatory complications: Secondary | ICD-10-CM | POA: Diagnosis not present

## 2023-10-06 DIAGNOSIS — L308 Other specified dermatitis: Secondary | ICD-10-CM

## 2023-10-06 DIAGNOSIS — E785 Hyperlipidemia, unspecified: Secondary | ICD-10-CM

## 2023-10-06 DIAGNOSIS — Z23 Encounter for immunization: Secondary | ICD-10-CM

## 2023-10-06 LAB — POCT UA - MICROALBUMIN
Albumin/Creatinine Ratio, Urine, POC: 20.7
Creatinine, POC: 83.7 mg/dL
Microalbumin Ur, POC: 17.3 mg/L

## 2023-10-06 LAB — POCT GLYCOSYLATED HEMOGLOBIN (HGB A1C): Hemoglobin A1C: 6.8 % — AB (ref 4.0–5.6)

## 2023-10-06 LAB — LIPID PANEL

## 2023-10-06 MED ORDER — ATORVASTATIN CALCIUM 40 MG PO TABS: 40.0000 mg | ORAL_TABLET | Freq: Every day | ORAL | 1 refills | Status: DC

## 2023-10-06 MED ORDER — LISINOPRIL 5 MG PO TABS: 5.0000 mg | ORAL_TABLET | Freq: Every day | ORAL | 1 refills | Status: DC

## 2023-10-06 NOTE — Progress Notes (Signed)
   Subjective:    Patient ID: Peter Daniels, male    DOB: 1946-05-19, 78 y.o.   MRN: [id number]  HPI He is here for Medicare well visit.  He does have underlying diabetes but has not needed an antidiabetic medication.  He does periodically check his blood sugars and states that the fasting numbers are all in the 120 range.  He continues on Lipitor and lisinopril and having no difficulty with that. He continues to drink 3 beverages per night and has been doing this for over 40 years.  He does not smoke. He does have some lesions present on his nose and would like to be returned to dermatology for further care of this.  He also has eczema and senile purpura.  Also has a history of colonic polyp with 1 adenomatous polyp seen in 2019. Review of Systems     Objective:    Physical Exam Alert and in no distress. Tympanic membranes and canals are normal. Pharyngeal area is normal. Neck is supple without adenopathy or thyromegaly. Cardiac exam shows a regular sinus rhythm without murmurs or gallops. Lungs are clear to auscultation. Exam of the nose does show some central scaling and erythema.  Purpuric lesions noted on both forearms.  Hemoglobin A1c is 6.8       Assessment & Plan:  Atherosclerosis of aorta (HCC)  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HCC) - Plan: CBC with Differential/Platelet, Comprehensive metabolic panel with GFR, Lipid panel, POCT glycosylated hemoglobin (Hb A1C), POCT UA - Microalbumin  Other eczema  Hx of colonic polyps  Hyperlipidemia associated with type 2 diabetes mellitus (HCC) - Plan: Lipid panel, atorvastatin (LIPITOR) 40 MG tablet  Hypertension associated with diabetes (HCC) - Plan: CBC with Differential/Platelet, Comprehensive metabolic panel with GFR, lisinopril (ZESTRIL) 5 MG tablet  Senile purpura (HCC)  Need for vaccination against Streptococcus pneumoniae - Plan: Pneumococcal conjugate vaccine 20-valent (Prevnar 20)  Actinic keratosis - Plan:  Ambulatory referral to Dermatology We will discuss of colon polyps with his next visit in 1 year.  At the present time he is comfortable with continuing to monitor the present situation and we will check him again in about 6 months.

## 2023-10-07 ENCOUNTER — Encounter: Payer: Self-pay | Admitting: Family Medicine

## 2023-10-07 LAB — CBC WITH DIFFERENTIAL/PLATELET
Basophils Absolute: 0 10*3/uL (ref 0.0–0.2)
Basos: 1 %
EOS (ABSOLUTE): 0.2 10*3/uL (ref 0.0–0.4)
Eos: 3 %
Hematocrit: 41.8 % (ref 37.5–51.0)
Hemoglobin: 14.2 g/dL (ref 13.0–17.7)
Immature Grans (Abs): 0 10*3/uL (ref 0.0–0.1)
Immature Granulocytes: 0 %
Lymphocytes Absolute: 1.6 10*3/uL (ref 0.7–3.1)
Lymphs: 25 %
MCH: 30.7 pg (ref 26.6–33.0)
MCHC: 34 g/dL (ref 31.5–35.7)
MCV: 91 fL (ref 79–97)
Monocytes Absolute: 0.5 10*3/uL (ref 0.1–0.9)
Monocytes: 7 %
Neutrophils Absolute: 4 10*3/uL (ref 1.4–7.0)
Neutrophils: 64 %
Platelets: 194 10*3/uL (ref 150–450)
RBC: 4.62 x10E6/uL (ref 4.14–5.80)
RDW: 12.3 % (ref 11.6–15.4)
WBC: 6.3 10*3/uL (ref 3.4–10.8)

## 2023-10-07 LAB — LIPID PANEL
Cholesterol, Total: 174 mg/dL (ref 100–199)
HDL: 70 mg/dL (ref >39)
LDL CALC COMMENT:: 2.5 ratio (ref 0.0–5.0)
LDL Chol Calc (NIH): 86 mg/dL (ref 0–99)
Triglycerides: 99 mg/dL (ref 0–149)
VLDL Cholesterol Cal: 18 mg/dL (ref 5–40)

## 2023-10-07 LAB — COMPREHENSIVE METABOLIC PANEL WITH GFR
ALT: 21 IU/L (ref 0–44)
AST: 22 IU/L (ref 0–40)
Albumin: 4.4 g/dL (ref 3.8–4.8)
Alkaline Phosphatase: 62 IU/L (ref 44–121)
BUN/Creatinine Ratio: 17 (ref 10–24)
BUN: 15 mg/dL (ref 8–27)
Bilirubin Total: 2 mg/dL — ABNORMAL HIGH (ref 0.0–1.2)
CO2: 24 mmol/L (ref 20–29)
Calcium: 9.6 mg/dL (ref 8.6–10.2)
Chloride: 98 mmol/L (ref 96–106)
Creatinine, Ser: 0.86 mg/dL (ref 0.76–1.27)
Globulin, Total: 2.4 g/dL (ref 1.5–4.5)
Glucose: 109 mg/dL — ABNORMAL HIGH (ref 70–99)
Potassium: 4.1 mmol/L (ref 3.5–5.2)
Sodium: 136 mmol/L (ref 134–144)
Total Protein: 6.8 g/dL (ref 6.0–8.5)
eGFR: 88 mL/min/{1.73_m2} (ref >59)

## 2023-10-07 MED ORDER — ROSUVASTATIN CALCIUM 40 MG PO TABS: 40.0000 mg | ORAL_TABLET | Freq: Every day | ORAL | 3 refills | Status: AC

## 2023-10-07 NOTE — Addendum Note (Signed)
 Addended by: Ronnald Nian on: 10/07/2023 08:27 AM   Modules accepted: Orders

## 2023-10-11 DIAGNOSIS — L570 Actinic keratosis: Secondary | ICD-10-CM | POA: Diagnosis not present

## 2023-10-11 DIAGNOSIS — L218 Other seborrheic dermatitis: Secondary | ICD-10-CM | POA: Diagnosis not present

## 2023-12-18 ENCOUNTER — Encounter: Payer: Self-pay | Admitting: Family Medicine

## 2023-12-18 DIAGNOSIS — Z860100 Personal history of colon polyps, unspecified: Secondary | ICD-10-CM

## 2024-03-08 ENCOUNTER — Encounter: Payer: Self-pay | Admitting: Family Medicine

## 2024-03-21 ENCOUNTER — Ambulatory Visit (INDEPENDENT_AMBULATORY_CARE_PROVIDER_SITE_OTHER): Payer: Medicare Other

## 2024-03-21 DIAGNOSIS — Z0000 Encounter for general adult medical examination without abnormal findings: Secondary | ICD-10-CM | POA: Diagnosis not present

## 2024-03-21 NOTE — Patient Instructions (Addendum)
 [name],  Thank you for taking the time for your Medicare Wellness Visit. I appreciate your continued commitment to your health goals. Please review the care plan we discussed, and feel free to reach out if I can assist you further.  Medicare recommends these wellness visits once per year to help you and your care team stay ahead of potential health issues. These visits are designed to focus on prevention, allowing your provider to concentrate on managing your acute and chronic conditions during your regular appointments.  Please note that Annual Wellness Visits do not include a physical exam. Some assessments may be limited, especially if the visit was conducted virtually. If needed, we may recommend a separate in-person follow-up with your provider.  Ongoing Care Seeing your primary care provider every 3 to 6 months helps us  monitor your health and provide consistent, personalized care.   Referrals If a referral was made during today's visit and you haven't received any updates within two weeks, please contact the referred provider directly to check on the status.  Recommended Screenings:  Health Maintenance  Topic Date Due   Zoster (Shingles) Vaccine (1 of 2) Never done   Colon Cancer Screening  01/13/2021   Complete foot exam   11/18/2021   Eye exam for diabetics  11/06/2023   Flu Shot  02/04/2024   COVID-19 Vaccine (5 - 2025-26 season) 03/06/2024   Hemoglobin A1C  04/06/2024   Yearly kidney function blood test for diabetes  10/05/2024   Yearly kidney health urinalysis for diabetes  10/05/2024   Medicare Annual Wellness Visit  03/21/2025   DTaP/Tdap/Td vaccine (3 - Td or Tdap) 01/30/2030   Pneumococcal Vaccine for age over 41  Completed   Hepatitis C Screening  Completed   HPV Vaccine  Aged Out   Meningitis B Vaccine  Aged Out       03/21/2024    8:11 AM  Advanced Directives  Does Patient Have a Medical Advance Directive? No  Would patient like information on creating  a medical advance directive? No - Patient declined   Advance Care Planning is important because it: Ensures you receive medical care that aligns with your values, goals, and preferences. Provides guidance to your family and loved ones, reducing the emotional burden of decision-making during critical moments.  Vision: Annual vision screenings are recommended for early detection of glaucoma, cataracts, and diabetic retinopathy. These exams can also reveal signs of chronic conditions such as diabetes and high blood pressure.  Dental: Annual dental screenings help detect early signs of oral cancer, gum disease, and other conditions linked to overall health, including heart disease and diabetes.  Please see the attached documents for additional preventive care recommendations.

## 2024-03-21 NOTE — Progress Notes (Signed)
 Subjective:   Peter Daniels is a 78 y.o. who presents for a Medicare Wellness preventive visit.  As a reminder, Annual Wellness Visits don't include a physical exam, and some assessments may be limited, especially if this visit is performed virtually. We may recommend an in-person follow-up visit with your provider if needed.  Visit Complete: Virtual I connected with  Peter Daniels on 03/21/24 by a audio enabled telemedicine application and verified that I am speaking with the correct person using two identifiers.  Patient Location: Home  Provider Location: Office/Clinic  I discussed the limitations of evaluation and management by telemedicine. The patient expressed understanding and agreed to proceed.  Vital Signs: Because this visit was a virtual/telehealth visit, some criteria may be missing or patient reported. Any vitals not documented were not able to be obtained and vitals that have been documented are patient reported.  VideoError- Librarian, academic were attempted between this provider and patient, however failed, due to patient having technical difficulties OR patient did not have access to video capability.  We continued and completed visit with audio only.   Persons Participating in Visit: Patient.  AWV Questionnaire: No: Patient Medicare AWV questionnaire was not completed prior to this visit.  Cardiac Risk Factors include: advanced age (>33men, >36 women);diabetes mellitus;dyslipidemia;male gender;hypertension     Objective:    Today's Vitals   There is no height or weight on file to calculate BMI.     03/21/2024    8:11 AM 03/09/2023    8:16 AM 11/28/2021    9:16 AM 11/18/2020    9:40 AM 11/17/2019   10:26 AM 11/17/2018    9:56 AM 01/23/2016   10:34 AM  Advanced Directives  Does Patient Have a Medical Advance Directive? No No No No No No No   Would patient like information on creating a medical advance directive? No - Patient  declined   Yes (ED - Information included in AVS) Yes (MAU/Ambulatory/Procedural Areas - Information given) Yes (MAU/Ambulatory/Procedural Areas - Information given)  Yes - Educational materials given      Data saved with a previous flowsheet row definition    Current Medications (verified) Outpatient Encounter Medications as of 03/21/2024  Medication Sig   Ascorbic Acid (VITAMIN C) 1000 MG tablet Take 1,000 mg by mouth daily.   diphenhydrAMINE HCl, Sleep, (ZZZQUIL PO) Take by mouth.   lisinopril  (ZESTRIL ) 5 MG tablet Take 1 tablet (5 mg total) by mouth daily.   Multiple Vitamins-Minerals (MULTIVITAMIN ADULTS 50+) TABS Take by mouth.   rosuvastatin  (CRESTOR ) 40 MG tablet Take 1 tablet (40 mg total) by mouth daily.   Cholecalciferol (VITAMIN D) 50 MCG (2000 UT) tablet Take 2,000 Units by mouth daily. (Patient not taking: Reported on 11/16/2022)   No facility-administered encounter medications on file as of 03/21/2024.    Allergies (verified) Sulfa antibiotics   History: Past Medical History:  Diagnosis Date   Cataract    Diabetes mellitus without complication (HCC)    Hx of colonic polyps 01/19/2018   Hyperlipidemia    Hypertension    Past Surgical History:  Procedure Laterality Date   CATARACT EXTRACTION Bilateral    09/2023   none     Family History  Problem Relation Age of Onset   Asthma Other    Cancer Other    Hypertension Other    Colon cancer Neg Hx    Esophageal cancer Neg Hx    Liver cancer Neg Hx    Pancreatic cancer Neg Hx  Rectal cancer Neg Hx    Stomach cancer Neg Hx    Social History   Socioeconomic History   Marital status: Single    Spouse name: Not on file   Number of children: Not on file   Years of education: Not on file   Highest education level: Not on file  Occupational History   Not on file  Tobacco Use   Smoking status: Former    Current packs/day: 0.00    Types: Cigarettes    Start date: 01/07/1970    Quit date: 01/08/1995    Years  since quitting: 29.2   Smokeless tobacco: Never  Vaping Use   Vaping status: Never Used  Substance and Sexual Activity   Alcohol use: Yes    Alcohol/week: 20.0 standard drinks of alcohol    Types: 20 Shots of liquor per week    Comment: 3 drinks/day   Drug use: No   Sexual activity: Not Currently  Other Topics Concern   Not on file  Social History Narrative   Not on file   Social Drivers of Health   Financial Resource Strain: Low Risk  (03/21/2024)   Overall Financial Resource Strain (CARDIA)    Difficulty of Paying Living Expenses: Not hard at all  Food Insecurity: No Food Insecurity (03/21/2024)   Hunger Vital Sign    Worried About Running Out of Food in the Last Year: Never true    Ran Out of Food in the Last Year: Never true  Transportation Needs: No Transportation Needs (03/21/2024)   PRAPARE - Administrator, Civil Service (Medical): No    Lack of Transportation (Non-Medical): No  Physical Activity: Inactive (03/21/2024)   Exercise Vital Sign    Days of Exercise per Week: 0 days    Minutes of Exercise per Session: 0 min  Stress: No Stress Concern Present (03/21/2024)   [employer] of Occupational Health - Occupational Stress Questionnaire    Feeling of Stress: Not at all  Social Connections: Socially Isolated (03/21/2024)   Social Connection and Isolation Panel    Frequency of Communication with Friends and Family: Never    Frequency of Social Gatherings with Friends and Family: More than three times a week    Attends Religious Services: Never    Database administrator or Organizations: No    Attends Engineer, structural: Never    Marital Status: Never married    Tobacco Counseling Counseling given: Not Answered    Clinical Intake:  Pre-visit preparation completed: Yes  Pain : No/denies pain     Nutritional Risks: None Diabetes: Yes CBG done?: No Did pt. bring in CBG monitor from home?: No  Lab Results  Component Value Date    HGBA1C 6.8 (A) 10/06/2023   HGBA1C 6.9 (A) 11/16/2022   HGBA1C 6.7 (A) 07/03/2022     How often do you need to have someone help you when you read instructions, pamphlets, or other written materials from your doctor or pharmacy?: 1 - Never  Interpreter Needed?: No  Information entered by :: NAllen LPN   Activities of Daily Living     03/21/2024    8:07 AM  In your present state of health, do you have any difficulty performing the following activities:  Hearing? 0  Vision? 0  Difficulty concentrating or making decisions? 0  Walking or climbing stairs? 0  Dressing or bathing? 0  Doing errands, shopping? 0  Preparing Food and eating ? N  Using the Toilet?  N  In the past six months, have you accidently leaked urine? N  Do you have problems with loss of bowel control? N  Managing your Medications? N  Managing your Finances? N  Housekeeping or managing your Housekeeping? N    Patient Care Team: Joyce Norleen BROCKS, MD as PCP - General (Family Medicine)  I have updated your Care Teams any recent Medical Services you may have received from other providers in the past year.     Assessment:   This is a routine wellness examination for Peter Daniels.  Hearing/Vision screen Hearing Screening - Comments:: Denies hearing issues Vision Screening - Comments:: Regular eye exams, in [place]   Goals Addressed             This Visit's Progress    Patient Stated       03/21/2024, denies goals       Depression Screen     03/21/2024    8:13 AM 10/06/2023    3:30 PM 03/09/2023    8:17 AM 11/28/2021    9:17 AM 11/18/2020    9:36 AM 11/17/2019    9:59 AM 11/17/2018    9:37 AM  PHQ 2/9 Scores  PHQ - 2 Score 0 0 0 0 0 0 0  PHQ- 9 Score 0  0        Fall Risk     03/21/2024    8:12 AM 10/06/2023    3:29 PM 03/09/2023    8:16 AM 11/16/2022   11:37 AM 11/28/2021    9:16 AM  Fall Risk   Falls in the past year? 0 1 1 1 1   Comment   stepped on something  pulled by dog  Number falls in past yr:  0 1 0 0 1  Injury with Fall? 0 0 0 0 0  Risk for fall due to : Medication side effect Other (Comment) Medication side effect No Fall Risks Medication side effect  Follow up Falls evaluation completed;Falls prevention discussed Falls evaluation completed Falls prevention discussed;Falls evaluation completed Falls evaluation completed Falls evaluation completed;Education provided;Falls prevention discussed      Data saved with a previous flowsheet row definition    MEDICARE RISK AT HOME:  Medicare Risk at Home Any stairs in or around the home?: Yes If so, are there any without handrails?: No Home free of loose throw rugs in walkways, pet beds, electrical cords, etc?: Yes Adequate lighting in your home to reduce risk of falls?: Yes Life alert?: No Use of a cane, walker or w/c?: No Grab bars in the bathroom?: No Shower chair or bench in shower?: No Elevated toilet seat or a handicapped toilet?: Yes  TIMED UP AND GO:  Was the test performed?  No  Cognitive Function: 6CIT completed        03/21/2024    8:13 AM 03/09/2023    8:18 AM 11/28/2021    9:18 AM  6CIT Screen  What Year? 0 points 0 points 0 points  What month? 0 points 0 points 0 points  What time? 0 points 0 points 0 points  Count back from 20 0 points 0 points 0 points  Months in reverse 0 points 0 points 0 points  Repeat phrase 0 points 0 points 4 points  Total Score 0 points 0 points 4 points    Immunizations Immunization History  Administered Date(s) Administered   Fluad Quad(high Dose 65+) 04/28/2022   INFLUENZA, HIGH DOSE SEASONAL PF 05/01/2014, 05/13/2015   Influenza Split 07/09/2011  PFIZER(Purple Top)SARS-COV-2 Vaccination 07/21/2019, 08/11/2019, 05/03/2020   PNEUMOCOCCAL CONJUGATE-20 10/06/2023   Pfizer(Comirnaty)Fall Seasonal Vaccine 12 years and older 04/28/2022   Pneumococcal Conjugate-13 05/01/2014   Pneumococcal Polysaccharide-23 03/10/2013   Tdap 09/05/2009, 01/31/2020    Screening Tests Health  Maintenance  Topic Date Due   Zoster Vaccines- Shingrix (1 of 2) Never done   Colonoscopy  01/13/2021   FOOT EXAM  11/18/2021   OPHTHALMOLOGY EXAM  11/06/2023   Influenza Vaccine  02/04/2024   COVID-19 Vaccine (5 - 2025-26 season) 03/06/2024   HEMOGLOBIN A1C  04/06/2024   Diabetic kidney evaluation - eGFR measurement  10/05/2024   Diabetic kidney evaluation - Urine ACR  10/05/2024   Medicare Annual Wellness (AWV)  03/21/2025   DTaP/Tdap/Td (3 - Td or Tdap) 01/30/2030   Pneumococcal Vaccine: 50+ Years  Completed   Hepatitis C Screening  Completed   HPV VACCINES  Aged Out   Meningococcal B Vaccine  Aged Out    Health Maintenance Items Addressed: Due for shingles, covid and flu vaccine.  Additional Screening:  Vision Screening: Recommended annual ophthalmology exams for early detection of glaucoma and other disorders of the eye. Is the patient up to date with their annual eye exam?  Yes  Who is the provider or what is the name of the office in which the patient attends annual eye exams? [facility]  Dental Screening: Recommended annual dental exams for proper oral hygiene  Community Resource Referral / Chronic Care Management: CRR required this visit?  No   CCM required this visit?  No   Plan:    I have personally reviewed and noted the following in the patient's chart:   Medical and social history Use of alcohol, tobacco or illicit drugs  Current medications and supplements including opioid prescriptions. Patient is not currently taking opioid prescriptions. Functional ability and status Nutritional status Physical activity Advanced directives List of other physicians Hospitalizations, surgeries, and ER visits in previous 12 months Vitals Screenings to include cognitive, depression, and falls Referrals and appointments  In addition, I have reviewed and discussed with patient certain preventive protocols, quality metrics, and best practice recommendations. A  written personalized care plan for preventive services as well as general preventive health recommendations were provided to patient.   Peter FORBES Dawn, LPN   [id number]   After Visit Summary: (MyChart) Due to this being a telephonic visit, the after visit summary with patients personalized plan was offered to patient via MyChart   Notes: Nothing significant to report at this time.

## 2024-04-11 ENCOUNTER — Ambulatory Visit: Admitting: Family Medicine

## 2024-04-11 ENCOUNTER — Encounter: Payer: Self-pay | Admitting: Family Medicine

## 2024-04-11 VITALS — BP 132/84 | HR 82 | Wt 228.0 lb

## 2024-04-11 DIAGNOSIS — E119 Type 2 diabetes mellitus without complications: Secondary | ICD-10-CM

## 2024-04-11 DIAGNOSIS — L219 Seborrheic dermatitis, unspecified: Secondary | ICD-10-CM | POA: Diagnosis not present

## 2024-04-11 LAB — POCT GLYCOSYLATED HEMOGLOBIN (HGB A1C): Hemoglobin A1C: 7.6 % — AB (ref 4.0–5.6)

## 2024-04-11 MED ORDER — KETOCONAZOLE 2 % EX CREA
1.0000 | TOPICAL_CREAM | Freq: Every day | CUTANEOUS | 0 refills | Status: AC
Start: 2024-04-11 — End: ?

## 2024-04-11 MED ORDER — KETOCONAZOLE 2 % EX SHAM: 1.0000 | MEDICATED_SHAMPOO | CUTANEOUS | 0 refills | Status: AC

## 2024-04-11 NOTE — Patient Instructions (Signed)
 Apps like Lumosity, CogniFit, Peak, Elevate, MindMate, and BrainHQ to help with short term memory loss.

## 2024-04-11 NOTE — Progress Notes (Signed)
 Name: Peter Daniels   Date of Visit: 04/11/24   Date of last visit with me: Visit date not found   CHIEF COMPLAINT:  Chief Complaint  Patient presents with   Diabetes    6 month follow up. Wants a prescription for Ketoconazole 2 %.        HPI:  Discussed the use of AI scribe software for clinical note transcription with the patient, who gave verbal consent to proceed.  History of Present Illness   Peter Daniels is an 78 year old male with seborrheic dermatitis who presents for a follow-up and medication refill.  He has been using a cream for seborrheic dermatitis, primarily located on his forehead. The cream alleviates symptoms for about a week after application, and he applies it once a week after showering. He has not tried using a shampoo in conjunction with the cream, although he has used Nizoral shampoo, which is a one percent formulation.  He has a history of diabetes, initially diagnosed with an A1c of 9.5. After starting metformin , his A1c decreased significantly to around 6.0. He is no longer on metformin , and his A1c has remained stable around 7.0. His A1c was recently 7.6, but he is not currently taking any medication for diabetes. His blood sugar levels can be influenced by factors such as stress and illness.  He mentions having senile purpura, more prominent on his left arm despite being right-handed. His skin is thinner and less elastic with age.  He expresses concern about his short-term memory, noting that he sometimes forgets why he entered a room. He describes himself as mentally active, writing books and engaging in activities that require focus. He has noticed a decline in his ability to recall recent events or names, although he has always found this challenging.  No urinary issues, such as increased frequency or difficulty starting and stopping urination.         OBJECTIVE:       03/21/2024    8:13 AM  Depression screen PHQ 2/9  Decreased  Interest 0  Down, Depressed, Hopeless 0  PHQ - 2 Score 0  Altered sleeping 0  Tired, decreased energy 0  Change in appetite 0  Feeling bad or failure about yourself  0  Trouble concentrating 0  Moving slowly or fidgety/restless 0  Suicidal thoughts 0  PHQ-9 Score 0  Difficult doing work/chores Not difficult at all     BP Readings from Last 3 Encounters:  04/11/24 132/84  10/06/23 122/82  11/16/22 124/70    BP 132/84   Pulse 82   Wt 228 lb (103.4 kg)   SpO2 98%   BMI 33.67 kg/m    Physical Exam          Physical Exam Constitutional:      Appearance: Normal appearance.  Neurological:     General: No focal deficit present.     Mental Status: He is alert and oriented to person, place, and time. Mental status is at baseline.     ASSESSMENT/PLAN:   Assessment & Plan Seborrheic dermatitis  Diabetes mellitus without complication (HCC)    Assessment and Plan    Seborrheic dermatitis of forehead and scalp Chronic seborrheic dermatitis on forehead and scalp, previously misdiagnosed as eczema. Current 1% Nizoral treatment effective, but 2% recommended for improved efficacy. - Prescribe 2% ketoconazole cream for forehead weekly. - Prescribe 2% ketoconazole shampoo for scalp weekly. - Send prescriptions to [facility].  Type 2 diabetes mellitus, well controlled Type  2 diabetes with A1c of 7.6, slightly elevated but acceptable for age. Managed with lifestyle modifications, previously on metformin . - Encourage increased physical activity, such as walking. - Advise dietary modifications, including reducing meal frequency. - Consider reintroducing low-dose metformin  if A1c not below 7. - Recheck A1c in three months.  Age-related memory changes Mild age-related memory changes affecting short-term memory. No significant cognitive decline. - Recommend cognitive training apps such as Lumosity or Cogfit.         Vaudine Dutan A. Vita MD [facility]

## 2024-07-12 ENCOUNTER — Encounter: Payer: Self-pay | Admitting: Family Medicine

## 2024-07-12 ENCOUNTER — Ambulatory Visit: Payer: Self-pay | Admitting: Family Medicine

## 2024-07-12 VITALS — BP 142/84 | HR 89 | Wt 224.8 lb

## 2024-07-12 DIAGNOSIS — I152 Hypertension secondary to endocrine disorders: Secondary | ICD-10-CM | POA: Diagnosis not present

## 2024-07-12 DIAGNOSIS — E1159 Type 2 diabetes mellitus with other circulatory complications: Secondary | ICD-10-CM

## 2024-07-12 DIAGNOSIS — E119 Type 2 diabetes mellitus without complications: Secondary | ICD-10-CM

## 2024-07-12 LAB — POCT GLYCOSYLATED HEMOGLOBIN (HGB A1C): Hemoglobin A1C: 7.3 % — AB (ref 4.0–5.6)

## 2024-07-12 MED ORDER — LISINOPRIL 10 MG PO TABS: 10.0000 mg | ORAL_TABLET | Freq: Every day | ORAL | 1 refills | Status: AC

## 2024-07-12 NOTE — Progress Notes (Signed)
" ° °  Name: Peter Daniels   Date of Visit: 07/12/2024   Date of last visit with me: 04/11/2024   CHIEF COMPLAINT:  Chief Complaint  Patient presents with   Follow-up    3 month follow up on diabetes. Recheck A1c.        HPI:  Discussed the use of AI scribe software for clinical note transcription with the patient, who gave verbal consent to proceed.  History of Present Illness   Peter Daniels is an 79 year old male with diabetes and hypertension who presents for routine follow-up.  His A1c level is stable at 7.3%. He has previously taken metformin  but prefers to avoid it as long as his A1c is not increasing. He has lost twenty pounds and wants to avoid weight fluctuations.  He currently takes lisinopril  5 mg. He is concerned that the elevated reading might be due to 'white coat effect'.  He has been a patient since 1982 and recently transitioned to a new doctor, with whom he feels comfortable. He appreciates a collaborative approach to his care, emphasizing the importance of addressing both mental and physical health aspects.         OBJECTIVE:       03/21/2024    8:13 AM  Depression screen PHQ 2/9  Decreased Interest 0  Down, Depressed, Hopeless 0  PHQ - 2 Score 0  Altered sleeping 0  Tired, decreased energy 0  Change in appetite 0  Feeling bad or failure about yourself  0  Trouble concentrating 0  Moving slowly or fidgety/restless 0  Suicidal thoughts 0  PHQ-9 Score 0   Difficult doing work/chores Not difficult at all     Data saved with a previous flowsheet row definition     BP Readings from Last 3 Encounters:  07/12/24 (!) 142/84  04/11/24 132/84  10/06/23 122/82    BP (!) 142/84   Pulse 89   Wt 224 lb 12.8 oz (102 kg)   SpO2 98%   BMI 33.20 kg/m    Physical Exam          Physical Exam Constitutional:      Appearance: Normal appearance.  Neurological:     General: No focal deficit present.     Mental Status: He is alert and oriented to  person, place, and time. Mental status is at baseline.     ASSESSMENT/PLAN:   Assessment & Plan Diabetes mellitus without complication (HCC)  Hypertension associated with diabetes (HCC)    Assessment and Plan    Type 2 diabetes mellitus A1c at 7.3 indicates suboptimal control. He prefers to avoid metformin  due to past experiences and weight concerns. - Monitor A1c and blood pressure. - Reassess diabetes management in six months.  Hypertension in type 2 diabetes mellitus Blood pressure elevated. Current lisinopril  dose is low at 5 mg. Increased dose warranted due to potential hyperglycemia impact. - Increase lisinopril  to 10 mg daily. - Prescription sent to [facility]. - Advised to double current pills until new prescription is filled.         Salaam Battershell A. Vita MD [facility] "
